# Patient Record
Sex: Male | Born: 1950 | Race: Black or African American | Hispanic: No | Marital: Single | Smoking: Former smoker
Health system: Southern US, Community
[De-identification: ages and names within clinical notes are randomized; demographics above are authoritative.]

## PROBLEM LIST (undated history)

## (undated) DIAGNOSIS — K469 Unspecified abdominal hernia without obstruction or gangrene: Secondary | ICD-10-CM

## (undated) DIAGNOSIS — J189 Pneumonia, unspecified organism: Secondary | ICD-10-CM

## (undated) DIAGNOSIS — B182 Chronic viral hepatitis C: Secondary | ICD-10-CM

## (undated) DIAGNOSIS — Z8619 Personal history of other infectious and parasitic diseases: Secondary | ICD-10-CM

## (undated) DIAGNOSIS — E785 Hyperlipidemia, unspecified: Secondary | ICD-10-CM

## (undated) DIAGNOSIS — A64 Unspecified sexually transmitted disease: Secondary | ICD-10-CM

## (undated) DIAGNOSIS — M1711 Unilateral primary osteoarthritis, right knee: Secondary | ICD-10-CM

## (undated) DIAGNOSIS — I10 Essential (primary) hypertension: Secondary | ICD-10-CM

## (undated) DIAGNOSIS — Z86010 Personal history of colonic polyps: Secondary | ICD-10-CM

## (undated) HISTORY — PX: HERNIA REPAIR: SHX51

## (undated) HISTORY — PX: TONSILLECTOMY: SUR1361

## (undated) HISTORY — DX: Unspecified abdominal hernia without obstruction or gangrene: K46.9

## (undated) HISTORY — DX: Pneumonia, unspecified organism: J18.9

## (undated) HISTORY — DX: Personal history of other infectious and parasitic diseases: Z86.19

## (undated) HISTORY — DX: Unspecified sexually transmitted disease: A64

## (undated) HISTORY — DX: Personal history of colonic polyps: Z86.010

## (undated) HISTORY — DX: Unilateral primary osteoarthritis, right knee: M17.11

## (undated) HISTORY — PX: OTHER SURGICAL HISTORY: SHX169

## (undated) HISTORY — DX: Hyperlipidemia, unspecified: E78.5

## (undated) HISTORY — DX: Chronic viral hepatitis C: B18.2

## (undated) HISTORY — DX: Essential (primary) hypertension: I10

---

## 2005-11-08 ENCOUNTER — Ambulatory Visit: Payer: Self-pay | Admitting: Nurse Practitioner

## 2005-11-14 ENCOUNTER — Ambulatory Visit: Payer: Self-pay | Admitting: Nurse Practitioner

## 2005-11-14 ENCOUNTER — Ambulatory Visit (HOSPITAL_COMMUNITY): Admission: RE | Admit: 2005-11-14 | Discharge: 2005-11-14 | Payer: Self-pay | Admitting: Nurse Practitioner

## 2005-11-21 ENCOUNTER — Ambulatory Visit: Payer: Self-pay | Admitting: Nurse Practitioner

## 2005-11-28 ENCOUNTER — Ambulatory Visit: Payer: Self-pay | Admitting: Internal Medicine

## 2006-10-02 ENCOUNTER — Encounter (INDEPENDENT_AMBULATORY_CARE_PROVIDER_SITE_OTHER): Payer: Self-pay | Admitting: *Deleted

## 2006-10-18 ENCOUNTER — Encounter (INDEPENDENT_AMBULATORY_CARE_PROVIDER_SITE_OTHER): Payer: Self-pay | Admitting: Nurse Practitioner

## 2006-10-18 ENCOUNTER — Ambulatory Visit: Payer: Self-pay | Admitting: *Deleted

## 2006-10-18 ENCOUNTER — Ambulatory Visit: Payer: Self-pay | Admitting: Family Medicine

## 2006-10-18 LAB — CONVERTED CEMR LAB
AST: 33 units/L (ref 0–37)
Albumin: 4.7 g/dL (ref 3.5–5.2)
Alkaline Phosphatase: 56 units/L (ref 39–117)
Basophils Relative: 1 % (ref 0–1)
Eosinophils Absolute: 0.1 10*3/uL (ref 0.0–0.7)
HDL: 40 mg/dL (ref >39)
LDL Cholesterol: 131 mg/dL — ABNORMAL HIGH (ref 0–99)
MCHC: 32.2 g/dL (ref 30.0–36.0)
MCV: 95.5 fL (ref 78.0–100.0)
Neutrophils Relative %: 45 % (ref 43–77)
Platelets: 211 10*3/uL (ref 150–400)
Potassium: 4.2 meq/L (ref 3.5–5.3)
RDW: 13.8 % (ref 11.5–14.0)
Sodium: 140 meq/L (ref 135–145)
TSH: 1.973 microintl units/mL (ref 0.350–5.50)
Total Protein: 8.3 g/dL (ref 6.0–8.3)
VLDL: 13 mg/dL (ref 0–40)

## 2006-10-31 ENCOUNTER — Ambulatory Visit: Payer: Self-pay | Admitting: Internal Medicine

## 2006-11-23 ENCOUNTER — Emergency Department (HOSPITAL_COMMUNITY): Admission: EM | Admit: 2006-11-23 | Discharge: 2006-11-23 | Payer: Self-pay | Admitting: Emergency Medicine

## 2006-12-09 ENCOUNTER — Encounter (INDEPENDENT_AMBULATORY_CARE_PROVIDER_SITE_OTHER): Payer: Self-pay | Admitting: Nurse Practitioner

## 2006-12-09 ENCOUNTER — Ambulatory Visit: Payer: Self-pay | Admitting: Internal Medicine

## 2006-12-09 LAB — CONVERTED CEMR LAB
AST: 28 units/L (ref 0–37)
Albumin: 4.4 g/dL (ref 3.5–5.2)
Alkaline Phosphatase: 48 units/L (ref 39–117)
BUN: 22 mg/dL (ref 6–23)
HDL: 38 mg/dL — ABNORMAL LOW (ref >39)
LDL Cholesterol: 90 mg/dL (ref 0–99)
Potassium: 4.2 meq/L (ref 3.5–5.3)
Total Bilirubin: 0.7 mg/dL (ref 0.3–1.2)
Total CHOL/HDL Ratio: 3.9
VLDL: 21 mg/dL (ref 0–40)

## 2007-02-07 ENCOUNTER — Ambulatory Visit (HOSPITAL_COMMUNITY): Admission: RE | Admit: 2007-02-07 | Discharge: 2007-02-07 | Payer: Self-pay | Admitting: Family Medicine

## 2007-02-18 ENCOUNTER — Ambulatory Visit: Payer: Self-pay | Admitting: Internal Medicine

## 2007-09-12 ENCOUNTER — Ambulatory Visit: Payer: Self-pay | Admitting: Family Medicine

## 2007-10-28 ENCOUNTER — Ambulatory Visit: Payer: Self-pay | Admitting: Internal Medicine

## 2007-10-28 LAB — CONVERTED CEMR LAB
Bacteria, UA: NONE SEEN
Basophils Absolute: 0 10*3/uL (ref 0.0–0.1)
Basophils Relative: 1 % (ref 0–1)
Bilirubin Urine: NEGATIVE
Chlamydia, Swab/Urine, PCR: NEGATIVE
Eosinophils Absolute: 0.1 10*3/uL (ref 0.0–0.7)
Ketones, ur: NEGATIVE mg/dL
MCHC: 33 g/dL (ref 30.0–36.0)
MCV: 94.7 fL (ref 78.0–100.0)
Neutro Abs: 2.7 10*3/uL (ref 1.7–7.7)
Neutrophils Relative %: 52 % (ref 43–77)
PSA: 0.83 ng/mL (ref 0.10–4.00)
Platelets: 236 10*3/uL (ref 150–400)
Protein, ur: NEGATIVE mg/dL
RBC: 4.87 M/uL (ref 4.22–5.81)
RDW: 13.8 % (ref 11.5–15.5)
RPR Ser Ql: REACTIVE — AB
Urine Glucose: NEGATIVE mg/dL
Urobilinogen, UA: 0.2 (ref 0.0–1.0)

## 2007-10-29 ENCOUNTER — Encounter (INDEPENDENT_AMBULATORY_CARE_PROVIDER_SITE_OTHER): Payer: Self-pay | Admitting: Internal Medicine

## 2008-01-05 ENCOUNTER — Ambulatory Visit: Payer: Self-pay | Admitting: Family Medicine

## 2008-03-19 ENCOUNTER — Ambulatory Visit: Payer: Self-pay | Admitting: Family Medicine

## 2008-04-08 ENCOUNTER — Ambulatory Visit: Payer: Self-pay | Admitting: Family Medicine

## 2008-07-12 ENCOUNTER — Ambulatory Visit: Payer: Self-pay | Admitting: Family Medicine

## 2008-07-26 ENCOUNTER — Ambulatory Visit: Payer: Self-pay | Admitting: Internal Medicine

## 2008-10-12 ENCOUNTER — Ambulatory Visit: Payer: Self-pay | Admitting: Family Medicine

## 2008-10-12 LAB — CONVERTED CEMR LAB
Testosterone: 317.33 ng/dL — ABNORMAL LOW (ref 350–890)
Vit D, 25-Hydroxy: 21 ng/mL — ABNORMAL LOW (ref 30–89)

## 2008-12-09 ENCOUNTER — Inpatient Hospital Stay (HOSPITAL_COMMUNITY): Admission: EM | Admit: 2008-12-09 | Discharge: 2008-12-10 | Payer: Self-pay | Admitting: Emergency Medicine

## 2008-12-09 ENCOUNTER — Emergency Department (HOSPITAL_COMMUNITY): Admission: EM | Admit: 2008-12-09 | Discharge: 2008-12-09 | Payer: Self-pay | Admitting: Family Medicine

## 2008-12-09 ENCOUNTER — Ambulatory Visit: Payer: Self-pay | Admitting: Internal Medicine

## 2008-12-10 ENCOUNTER — Encounter (INDEPENDENT_AMBULATORY_CARE_PROVIDER_SITE_OTHER): Payer: Self-pay | Admitting: Internal Medicine

## 2008-12-23 DIAGNOSIS — I10 Essential (primary) hypertension: Secondary | ICD-10-CM | POA: Insufficient documentation

## 2008-12-23 DIAGNOSIS — R1013 Epigastric pain: Secondary | ICD-10-CM

## 2008-12-23 DIAGNOSIS — Z9189 Other specified personal risk factors, not elsewhere classified: Secondary | ICD-10-CM | POA: Insufficient documentation

## 2008-12-23 DIAGNOSIS — R42 Dizziness and giddiness: Secondary | ICD-10-CM

## 2008-12-23 DIAGNOSIS — R079 Chest pain, unspecified: Secondary | ICD-10-CM

## 2008-12-24 ENCOUNTER — Ambulatory Visit: Payer: Self-pay | Admitting: Cardiology

## 2009-01-21 ENCOUNTER — Ambulatory Visit: Payer: Self-pay | Admitting: Cardiology

## 2009-01-21 ENCOUNTER — Encounter: Payer: Self-pay | Admitting: Cardiology

## 2009-01-21 ENCOUNTER — Ambulatory Visit (HOSPITAL_COMMUNITY): Admission: RE | Admit: 2009-01-21 | Discharge: 2009-01-21 | Payer: Self-pay | Admitting: Cardiology

## 2009-01-21 ENCOUNTER — Ambulatory Visit: Payer: Self-pay

## 2009-05-20 ENCOUNTER — Ambulatory Visit: Payer: Self-pay | Admitting: Family Medicine

## 2010-02-20 ENCOUNTER — Ambulatory Visit (INDEPENDENT_AMBULATORY_CARE_PROVIDER_SITE_OTHER): Payer: Self-pay

## 2010-02-20 ENCOUNTER — Inpatient Hospital Stay (INDEPENDENT_AMBULATORY_CARE_PROVIDER_SITE_OTHER)
Admission: RE | Admit: 2010-02-20 | Discharge: 2010-02-20 | Disposition: A | Discharge status: Home / Self Care | Payer: Self-pay | Source: Ambulatory Visit | Attending: Family Medicine | Admitting: Family Medicine

## 2010-02-20 DIAGNOSIS — J069 Acute upper respiratory infection, unspecified: Secondary | ICD-10-CM

## 2010-04-19 LAB — LIPID PANEL
HDL: 36 mg/dL — ABNORMAL LOW (ref >39)
Total CHOL/HDL Ratio: 4.1 RATIO

## 2010-04-19 LAB — HEMOGLOBIN A1C
Hgb A1c MFr Bld: 5.5 % (ref 4.6–6.1)
Mean Plasma Glucose: 111 mg/dL

## 2010-04-19 LAB — TROPONIN I
Troponin I: 0.02 ng/mL (ref 0.00–0.06)
Troponin I: 0.03 ng/mL (ref 0.00–0.06)

## 2010-04-19 LAB — BASIC METABOLIC PANEL
CO2: 21 mEq/L (ref 19–32)
Chloride: 106 mEq/L (ref 96–112)
Creatinine, Ser: 0.9 mg/dL (ref 0.4–1.5)

## 2010-04-19 LAB — DIFFERENTIAL
Lymphs Abs: 1.2 10*3/uL (ref 0.7–4.0)
Monocytes Relative: 5 % (ref 3–12)
Neutro Abs: 4.6 10*3/uL (ref 1.7–7.7)
Neutrophils Relative %: 74 % (ref 43–77)

## 2010-04-19 LAB — CBC
MCV: 95.6 fL (ref 78.0–100.0)
RBC: 4.63 MIL/uL (ref 4.22–5.81)
WBC: 6.2 10*3/uL (ref 4.0–10.5)

## 2010-04-19 LAB — CK TOTAL AND CKMB (NOT AT ARMC)
CK, MB: 6.5 ng/mL — ABNORMAL HIGH (ref 0.3–4.0)
Relative Index: 1.5 (ref 0.0–2.5)
Total CK: 338 U/L — ABNORMAL HIGH (ref 7–232)

## 2010-04-19 LAB — POCT CARDIAC MARKERS: Myoglobin, poc: >500 ng/mL (ref 12–200)

## 2011-12-21 ENCOUNTER — Encounter: Payer: Self-pay | Admitting: Radiation Oncology

## 2011-12-21 ENCOUNTER — Ambulatory Visit (INDEPENDENT_AMBULATORY_CARE_PROVIDER_SITE_OTHER): Payer: No Typology Code available for payment source | Admitting: Radiation Oncology

## 2011-12-21 VITALS — BP 160/90 | HR 77 | Temp 97.8°F | Ht 70.0 in | Wt 228.2 lb

## 2011-12-21 DIAGNOSIS — E785 Hyperlipidemia, unspecified: Secondary | ICD-10-CM

## 2011-12-21 DIAGNOSIS — Z0000 Encounter for general adult medical examination without abnormal findings: Secondary | ICD-10-CM | POA: Insufficient documentation

## 2011-12-21 DIAGNOSIS — I10 Essential (primary) hypertension: Secondary | ICD-10-CM

## 2011-12-21 DIAGNOSIS — Z23 Encounter for immunization: Secondary | ICD-10-CM

## 2011-12-21 DIAGNOSIS — Z8619 Personal history of other infectious and parasitic diseases: Secondary | ICD-10-CM

## 2011-12-21 LAB — CBC
HCT: 45.3 % (ref 39.0–52.0)
MCV: 94 fL (ref 78.0–100.0)
Platelets: 212 10*3/uL (ref 150–400)
RBC: 4.82 MIL/uL (ref 4.22–5.81)
WBC: 4.5 10*3/uL (ref 4.0–10.5)

## 2011-12-21 LAB — LIPID PANEL
HDL: 34 mg/dL — ABNORMAL LOW (ref >39)
Total CHOL/HDL Ratio: 4.8 Ratio
Triglycerides: 84 mg/dL (ref <150)

## 2011-12-21 LAB — COMPREHENSIVE METABOLIC PANEL
CO2: 25 mEq/L (ref 19–32)
Calcium: 9.3 mg/dL (ref 8.4–10.5)
Creat: 0.87 mg/dL (ref 0.50–1.35)
Glucose, Bld: 87 mg/dL (ref 70–99)
Total Bilirubin: 0.9 mg/dL (ref 0.3–1.2)
Total Protein: 7.6 g/dL (ref 6.0–8.3)

## 2011-12-21 MED ORDER — PRAVASTATIN SODIUM 20 MG PO TABS: 20.0000 mg | ORAL_TABLET | Freq: Every evening | ORAL | Status: DC

## 2011-12-21 MED ORDER — SILDENAFIL CITRATE 50 MG PO TABS: 50.0000 mg | ORAL_TABLET | ORAL | Status: DC | PRN

## 2011-12-21 MED ORDER — AMLODIPINE BESYLATE 5 MG PO TABS: 5.0000 mg | ORAL_TABLET | Freq: Every day | ORAL | Status: DC

## 2011-12-21 MED ORDER — ROSUVASTATIN CALCIUM 5 MG PO TABS: 5.0000 mg | ORAL_TABLET | Freq: Every day | ORAL | Status: DC

## 2011-12-21 NOTE — Assessment & Plan Note (Signed)
BP Readings from Last 3 Encounters:  12/21/11 160/90  12/24/08 130/80    Sodium  Date Value Range Status  12/09/2008 137  135 - 145 mEq/L Final     Potassium  Date Value Range Status  12/09/2008 3.9  3.5 - 5.1 mEq/L Final     Creatinine, Ser  Date Value Range Status  12/09/2008 0.90  0.4 - 1.5 mg/dL Final    Assessment:  Blood pressure control: mildly elevated  Progress toward BP goal:     Comments: BP elevated over previous readings, at ~160/90 today. This is the pt's first visit to [facility]. No change will be made to his med regimen (norvasc 5mg ) as he states he has been out of his medications for a few days, which is likely why his BP is elevated today.   Plan:  Medications:  continue current medications  Educational resources provided:    Self management tools provided:    Other plans: Pt will continue norvasc 5mg  and f/u in 1 month. If BP well-controlled at that time, pt may f/u in 61yr.

## 2011-12-21 NOTE — Addendum Note (Signed)
Addended by: Elenor Legato R on: 12/21/2011 12:05 PM   Modules accepted: Orders

## 2011-12-21 NOTE — Assessment & Plan Note (Addendum)
Pt has previously had good control of LDL on crestor 10mg . As pt states he has insomnia with this dose, will decrease to 5mg  as it has similar efficacy. Lipid panel drawn and pending.  -dec crestor from 10mg  -> 5mg  - consider inc statin dose if LDL elevated (at time of f/u in 1 month)  12/21/2011 update: Called from outpatient pharmacy--pt no longer able to afford crestor. Prescribed 20mg  pravastatin, which is on $4 list at [facility]. Appropriateness of continuing this less potent statin should be reassessed at f/u in 1 month if pt's LDL returns as elevated.

## 2011-12-21 NOTE — Progress Notes (Signed)
I discussed the management of this patient with Dr. Lavena Bullion and I agree with his note.   Thanks [employer]

## 2011-12-21 NOTE — Progress Notes (Signed)
  Subjective:    Patient ID: Scott Burgess, male    DOB: [birth year], 61 y.o.   MRN: [id number]  HPI Pt is a 60 yo man with PMH of HTN, HLD, (see "Problem List" for comprehensive PMH) who presents to establish care at the [facility] closure of [facility]. Pt states that he has been feeling well, and needs refills of his medications which include norvasc and crestor. Pt states that the 10mg  crestor causes him to have difficulty sleeping, but states that he has no other med side effects. He states he has been out of both of these meds for a few days. Pt denies any other complaints.    Review of Systems  Constitutional: Negative for fever and chills.  HENT: Negative.   Eyes: Negative.   Respiratory: Negative for shortness of breath and wheezing.   Cardiovascular: Negative for chest pain and leg swelling.  Gastrointestinal: Negative for nausea, vomiting, abdominal pain, diarrhea and blood in stool.  Genitourinary: Negative for dysuria, frequency and hematuria.  Musculoskeletal: Negative.   Skin: Negative.   Neurological: Negative.   Hematological: Negative.   Psychiatric/Behavioral: Negative.        Objective:   Physical Exam  Constitutional: He is oriented to person, place, and time. He appears well-developed and well-nourished. No distress.  HENT:  Head: Normocephalic and atraumatic.  Mouth/Throat: Oropharynx is clear and moist.  Eyes: Conjunctivae normal are normal. Pupils are equal, round, and reactive to light. No scleral icterus.  Neck: Normal range of motion. Neck supple. No tracheal deviation present. No thyromegaly present.  Cardiovascular: Normal rate and regular rhythm.   No murmur heard. Pulmonary/Chest: Effort normal and breath sounds normal. He has no wheezes. He has no rales.  Abdominal: Soft. Bowel sounds are normal. He exhibits no distension. There is no tenderness.  Musculoskeletal: Normal range of motion. He exhibits no edema.  Neurological: He is alert and  oriented to person, place, and time. No cranial nerve deficit.  Skin: Skin is warm and dry. No rash noted.  Psychiatric: He has a normal mood and affect. His behavior is normal.          Assessment & Plan:

## 2011-12-21 NOTE — Patient Instructions (Addendum)
Hypertension Hypertension is another name for high blood pressure. High blood pressure may mean that your heart needs to work harder to pump blood. Blood pressure consists of two numbers, which includes a higher number over a lower number (example: 110/72). HOME CARE   Make lifestyle changes as told by your doctor. This may include weight loss and exercise.  Take your blood pressure medicine every day.  Limit how much salt you use.  Stop smoking if you smoke.  Do not use drugs.  Talk to your doctor if you are using decongestants or birth control pills. These medicines might make blood pressure higher.  Females should not drink more than 1 alcoholic drink per day. Males should not drink more than 2 alcoholic drinks per day.  See your doctor as told. GET HELP RIGHT AWAY IF:   You have a blood pressure reading with a top number of 180 or higher.  You get a very bad headache.  You get blurred or changing vision.  You feel confused.  You feel weak, numb, or faint.  You get chest or belly (abdominal) pain.  You throw up (vomit).  You cannot breathe very well. MAKE SURE YOU:   Understand these instructions.  Will watch your condition.  Will get help right away if you are not doing well or get worse. Document Released: 06/20/2007 Document Revised: 03/26/2011 Document Reviewed: 06/20/2007 [facility] Patient Information 2013 [place], [place].  General Instructions: - Please continue to take your 5mg  norvasc daily as prescribed. - Stop taking your 10mg  crestor and begin taking 5mg  crestor, as we decreased your dose during this visit.  - You will be contacted to schedule a screening colonoscopy.  - Return to clinic in approximately 1 month for a follow-up visit for your high blood pressure, at which time we will also review today's lab work.     Treatment Goals:  Goals (1 Years of Data) as of 12/21/2011          As of Today As of Today 12/24/08     Blood Pressure    .  Blood Pressure < 140/90  160/90 156/89 130/80      Self Care Goals & Plans:  Self Care Goal 12/21/2011  Manage my medications take my medicines as prescribed  Eat healthy foods eat foods that are low in salt  Be physically active take a walk every day

## 2012-01-22 ENCOUNTER — Ambulatory Visit (INDEPENDENT_AMBULATORY_CARE_PROVIDER_SITE_OTHER): Payer: No Typology Code available for payment source | Admitting: Radiation Oncology

## 2012-01-22 ENCOUNTER — Encounter: Payer: Self-pay | Admitting: Radiation Oncology

## 2012-01-22 VITALS — BP 138/86 | HR 69 | Temp 97.9°F | Ht 70.0 in | Wt 227.2 lb

## 2012-01-22 DIAGNOSIS — R945 Abnormal results of liver function studies: Secondary | ICD-10-CM

## 2012-01-22 DIAGNOSIS — N529 Male erectile dysfunction, unspecified: Secondary | ICD-10-CM

## 2012-01-22 DIAGNOSIS — Z8619 Personal history of other infectious and parasitic diseases: Secondary | ICD-10-CM

## 2012-01-22 DIAGNOSIS — Z0000 Encounter for general adult medical examination without abnormal findings: Secondary | ICD-10-CM

## 2012-01-22 DIAGNOSIS — R7989 Other specified abnormal findings of blood chemistry: Secondary | ICD-10-CM

## 2012-01-22 DIAGNOSIS — I10 Essential (primary) hypertension: Secondary | ICD-10-CM

## 2012-01-22 HISTORY — DX: Personal history of other infectious and parasitic diseases: Z86.19

## 2012-01-22 LAB — COMPREHENSIVE METABOLIC PANEL
Alkaline Phosphatase: 52 U/L (ref 39–117)
BUN: 18 mg/dL (ref 6–23)
Glucose, Bld: 93 mg/dL (ref 70–99)
Sodium: 142 mEq/L (ref 135–145)
Total Bilirubin: 0.8 mg/dL (ref 0.3–1.2)

## 2012-01-22 MED ORDER — AMLODIPINE BESYLATE 5 MG PO TABS: 5.0000 mg | ORAL_TABLET | Freq: Every day | ORAL | Status: DC

## 2012-01-22 MED ORDER — PRAVASTATIN SODIUM 20 MG PO TABS: 20.0000 mg | ORAL_TABLET | Freq: Every evening | ORAL | Status: DC

## 2012-01-22 NOTE — Assessment & Plan Note (Addendum)
Patient AST and ALT were elevated in 12/2011. Unclear etiology, as patient denies any history of hepatitis or of previous elevation of LFTs. This issue will require further investigation. 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 He was called and informed of this result. HCV RNA titer was attempted to be drawn as an add-on for this lab result, however was ultimately not performed. Pt appears to have an appointment with Dr. Leone Payor on 03/06/2012, at which time it may be appropriate for him to have his HCV RNA quant checked, otherwise he can return to clinic to have it tested. It is possible that the further elevation in his LFTs may be 2/2 pravastatin, and consideration of d/c'ing this med may be reasonable if LFTs continue to rise (although more likely transient).    Lab Results  Component Value Date   ALT 202* 01/22/2012   AST 128* 01/22/2012   ALKPHOS 52 01/22/2012   BILITOT 0.8 01/22/2012

## 2012-01-22 NOTE — Assessment & Plan Note (Signed)
Patient is pursuing medication assistance for Viagra through [employer]. Requisite paperwork was completed today during visit.

## 2012-01-22 NOTE — Assessment & Plan Note (Signed)
Referral was made again today for colonoscopy, as there was difficulty scheduling previous GI referral visit with [facility].

## 2012-01-22 NOTE — Patient Instructions (Signed)
Continue taking your amlodipine and pravastatin at the same doses. Have a great day.

## 2012-01-22 NOTE — Assessment & Plan Note (Signed)
BP Readings from Last 3 Encounters:  01/22/12 138/86  12/21/11 160/90  12/24/08 130/80    Lab Results  Component Value Date   NA 140 12/21/2011   K 4.0 12/21/2011   CREATININE 0.87 12/21/2011    Assessment:  Blood pressure control: controlled  Progress toward BP goal:  at goal  Comments: improved today after restarting norvasc 5mg   Plan:  Medications:  continue current medications  Educational resources provided:    Self management tools provided:    Other plans: f/u in 6 months

## 2012-01-22 NOTE — Progress Notes (Signed)
  Subjective:    Patient ID: Scott Burgess, male    DOB: 12-Dec-1950, 62 y.o.   MRN: [id number]  HPI Patient returns to clinic for one-month followup regarding his hypertension. He has been taking his Norvasc 5 mg daily since the previous visit.  Patient also has been taking pravastatin 20 mg daily. He was prescribed Viagra, however was unable to afford this but no prescription insurance. He requests for a medication assistance forms be filled out today for a Viagra prescription through [employer].  He denies any other complaints today. Patient had mildly elevated AST and ALT on previous unit in 12/2011. He denies any history of hepatitis or of elevated LFTs in the past. He denies any complaints today, and states that he feels well.  Review of Systems  All other systems reviewed and are negative.       Objective:   Physical Exam  Constitutional: He is oriented to person, place, and time. He appears well-developed and well-nourished. No distress.  HENT:  Head: Normocephalic and atraumatic.  Mouth/Throat: No oropharyngeal exudate.  Eyes: Pupils are equal, round, and reactive to light. No scleral icterus.  Neck: Normal range of motion. Neck supple. No tracheal deviation present.  Cardiovascular: Normal rate and regular rhythm.   No murmur heard. Pulmonary/Chest: Effort normal. He has no wheezes. He has no rales.  Abdominal: Soft. Bowel sounds are normal. He exhibits no distension. There is no tenderness.  Musculoskeletal: Normal range of motion. He exhibits no edema and no tenderness.  Neurological: He is alert and oriented to person, place, and time. No cranial nerve deficit.  Skin: Skin is warm and dry. No erythema.  Psychiatric: He has a normal mood and affect. His behavior is normal.          Assessment & Plan:

## 2012-01-23 LAB — HEPATITIS PANEL, ACUTE
Hep A IgM: NEGATIVE
Hep B C IgM: NEGATIVE

## 2012-01-23 LAB — HIV ANTIBODY (ROUTINE TESTING W REFLEX): HIV: NONREACTIVE

## 2012-02-05 ENCOUNTER — Telehealth: Payer: Self-pay | Admitting: *Deleted

## 2012-02-05 MED ORDER — SILDENAFIL CITRATE 50 MG PO TABS: 50.0000 mg | ORAL_TABLET | ORAL | Status: DC | PRN

## 2012-02-05 NOTE — Telephone Encounter (Signed)
Jan. 21, 2014 Message left by "Greggory Stallion" at [employer] that med package received from our clinic with no patient name attached.  Dr. Lavena Bullion had signed medication assistance program papers for pt for [employer] for Viagra without realizing that clinic had no medication assistance program personnel to track and disperse meds. [employer] med asst program was contacted and a [employer] return label was to be issued to our receiving dock to return the meds. Dr. Lavena Bullion advised of this issue when med bottle arrived Friday, Jan. 17, and returned Friday, Jan. 17. Pt can apply through the [facility] Medication Assistance program (MAP) for the medication if Dr. Lavena Bullion gives him a prescription to take to MAP and Dr. Lavena Bullion so advised. RN sent message to Dr. Lavena Bullion asking if he would inform pt of issue re: med asst program or if he wanted RN to contact pt. No response from Dr. Lavena Bullion received to date.  "Greggory Stallion" at [employer] states clinic needs to call him back and explain which pt meds were for ( packet insert stated pt name when package was returned with the med at the loading dock 1/17). RN attempted to call him back x 2 and left messages as he was currently not available.  Jan. 21 - Pt contacted and informed that [employer] application should have been submitted to MAP, that med sent to [place] and returned to [employer] so that they would know pt had not received the Viagra. Pt had asked Dr. Lavena Bullion to send Rx to [facility] thinking his MAP "paper" would allow him to buy the medicine there at a discount, or be free. [facility] told him each pill would cost $30, so now pt is willing to take the Rx to MAP and complete his form at MAP so that they can process the application and he can get his Viagra "for free like my friend does."  Pt advised that Dr. Lavena Bullion is not in the clinic today but will come by tomorrow to print and sign a prescription which this RN will mail to the pt for the pt to take to MAP and complete a Pfizer application there. Meanwhile, this  RN still trying to contact [place] [employer] rep to confirm med returned and pt never received it at this clinic so that pt will still be eligible for asst thru MAP.  Dorie Rank, RN, 02/06/2012, 3:29P

## 2012-02-12 ENCOUNTER — Telehealth: Payer: Self-pay | Admitting: *Deleted

## 2012-02-12 NOTE — Telephone Encounter (Signed)
I've filled out 3-4 separate prescriptions for him for this medication in attempts to help him get it from multiple different pharmacies. I understand his frustration, however without any insurance it may be that he unfortunately has to pay it out of pocket. We apparently don't manage med assistance from outside vendors such as pfizer in the [facility], as was previously attempted. Jasmine December has assisted me in trying to solve this problem over the last couple of weeks, but it seems we haven't had any luck.  If anyone has any ideas regarding med assistance for viagra for this patient I would appreciate their assistance.

## 2012-02-12 NOTE — Telephone Encounter (Signed)
Returned pt's call. Pt called about how he can receive Viagara from [employer]. Stated he went to MAP this morning and was told they do not carry this medication neither does [facility]. I called [facility] and they do not either.  He stated Dr Lavena Bullion needs to find an answer to this problem.  Pt called back and stated he will call [employer] to see if they will send the med to his home.

## 2012-02-14 ENCOUNTER — Encounter: Payer: Self-pay | Admitting: Internal Medicine

## 2012-02-15 ENCOUNTER — Encounter: Payer: Self-pay | Admitting: Licensed Clinical Social Worker

## 2012-02-15 ENCOUNTER — Encounter: Payer: Self-pay | Admitting: Internal Medicine

## 2012-02-19 NOTE — Addendum Note (Signed)
Addended by: Neomia Dear on: 02/19/2012 06:06 PM   Modules accepted: Orders

## 2012-03-05 ENCOUNTER — Encounter: Payer: Self-pay | Admitting: Licensed Clinical Social Worker

## 2012-03-06 ENCOUNTER — Encounter: Payer: Self-pay | Admitting: Internal Medicine

## 2012-03-06 ENCOUNTER — Other Ambulatory Visit (INDEPENDENT_AMBULATORY_CARE_PROVIDER_SITE_OTHER): Payer: No Typology Code available for payment source

## 2012-03-06 ENCOUNTER — Ambulatory Visit (INDEPENDENT_AMBULATORY_CARE_PROVIDER_SITE_OTHER): Payer: Self-pay | Admitting: Internal Medicine

## 2012-03-06 ENCOUNTER — Encounter: Payer: Self-pay | Admitting: Licensed Clinical Social Worker

## 2012-03-06 VITALS — BP 132/80 | HR 80 | Ht 69.75 in | Wt 231.0 lb

## 2012-03-06 DIAGNOSIS — R748 Abnormal levels of other serum enzymes: Secondary | ICD-10-CM

## 2012-03-06 DIAGNOSIS — Z1211 Encounter for screening for malignant neoplasm of colon: Secondary | ICD-10-CM

## 2012-03-06 DIAGNOSIS — B1920 Unspecified viral hepatitis C without hepatic coma: Secondary | ICD-10-CM

## 2012-03-06 DIAGNOSIS — R7401 Elevation of levels of liver transaminase levels: Secondary | ICD-10-CM

## 2012-03-06 LAB — HEPATIC FUNCTION PANEL: Albumin: 4 g/dL (ref 3.5–5.2)

## 2012-03-06 MED ORDER — NA SULFATE-K SULFATE-MG SULF 17.5-3.13-1.6 GM/177ML PO SOLN: ORAL | Status: DC

## 2012-03-06 NOTE — Patient Instructions (Addendum)
You have been scheduled for a colonoscopy with propofol. Please follow written instructions given to you at your visit today.  Please use the surprep kit you have been given today. If you use inhalers (even only as needed) or a CPAP machine, please bring them with you on the day of your procedure.  Your physician has requested that you go to the basement for lab work before leaving today.  You have been scheduled for an abdominal ultrasound at [facility] (1st floor of hospital) on 03/11/12 at 9am. Please arrive 15 minutes prior to your appointment for registration. Make certain not to have anything to eat or drink 6 hours prior to your appointment. Should you need to reschedule your appointment, please contact radiology at [phone]. This test typically takes about 30 minutes to perform.  Thank you for choosing me and [facility].  Iva Boop, M.D., [facility]

## 2012-03-06 NOTE — Progress Notes (Signed)
Subjective:    Patient ID: Scott Burgess, male    DOB: 02/12/1950, 62 y.o.   MRN: [id number]  HPI This middle-aged man presents for evaluation of abnormal transaminases. He has had elevated transaminases at least twice in the past year with levels up to to 4x NL. No abdominal pain or jaundice. 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 He is abstinent for many years and credits incarceration and [facility] with saving his life. GI ROS is otherwise negative.  No Known Allergies Outpatient Prescriptions Prior to Visit  Medication Sig Dispense Refill  . amLODipine (NORVASC) 5 MG tablet Take 1 tablet (5 mg total) by mouth daily.  30 tablet  5  . pravastatin (PRAVACHOL) 20 MG tablet Take 1 tablet (20 mg total) by mouth every evening.  30 tablet  5  . sildenafil (VIAGRA) 50 MG tablet Take 1 tablet (50 mg total) by mouth as needed for erectile dysfunction.  20 tablet  1   No facility-administered medications prior to visit.   Past Medical History  Diagnosis Date  . Hyperlipidemia   . Hypertension   . Hernia   . Pneumonia   . 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 Tonsillectomhy     History   Social History  . Marital Status: Single    Spouse Name: N/A    Number of Children: 3  . Years of Education: N/A   Occupational History  .     Social History Main Topics  . Smoking status: Former Smoker    Quit date: 01/15/2005  . Smokeless tobacco: Never Used  . Alcohol Use: No  . Drug Use: No    Family History  Problem Relation Age of Onset  . Diabetes Mother   . Diabetes Maternal Grandmother    Review of Systems + allergies, arthritis, night sweats, skin rash All other ROS negative    Objective:   Physical Exam General:  Well-developed, well-nourished and in no acute  distress Eyes:  anicteric. ENT:   Mouth and posterior pharynx free of lesions,but missing teeth and poor repair, caries  Neck:   supple w/o thyromegaly or mass.  Lungs: Clear to auscultation bilaterally. Heart:  S1S2, no rubs, murmurs, gallops. Abdomen:  soft, non-tender, no hepatosplenomegaly, hernia, or mass and BS+.  Rectal: deferred Lymph:  no cervical or supraclavicular adenopathy. Extremities:   no edema Skin   no rash or stigmata chronic liver disease in upper body Neuro:  A&O x 3.  Psych:  appropriate mood and  Affect.   Data Reviewed: Labs in [facility]    Assessment & Plan:  Abnormal transaminases - Plan: Hepatic function panel, Hepatitis A antibody, total, Hepatitis B surface antibody, Hepatitis B core antibody, total, Hepatitis C genotype, Hepatitis C RNA quantitative  Hepatitis C - Plan: Hepatitis C genotype, US Abdomen Limited RUQ, Hepatitis C RNA quantitative   Need to sort out genotype and get imaging of liver. He may be a candidate for HCV therapy - ? If he would qualify for a trial as he could not afford therapy. Liver bipsy will not change what we do right now, I do not think.   Special screening for malignant neoplasms, colon - Plan: colonoscopy. The risks and benefits as well as alternatives of endoscopic  procedure(s) have been discussed and reviewed. All questions answered. The patient agrees to proceed.   WU:[id number], Clearence Cheek, MD

## 2012-03-06 NOTE — Progress Notes (Signed)
Ms. Dougal presents as a Walk-in this morning to see CSW regarding [employer] application. CSW slowly explained to pt that [facility] does not participate in [employer]'s patient assistance program.  Pt did not understand why he could not pick up medication from [facility].  CSW informed Mr. Weber due to budget constraints and staffing [facility] could not participate, therefore the [facility] application was mailed to him.  CSW explained that [employer] works along with [employer]'s PAP.  Pt had mailed incomplete application back to CSW.  CSW informed Mr. Boodram of items he is in need of to complete application.  Pt provided CSW with original Rx for Viagra and W-2 information from 2013.  CSW made copy of pt's  ID and fax completed application to [phone], attention: Wynonia Sours.  Ms. Kathyrn Sheriff is pt's assigned Social Worker at [employer].  CSW faxed original Rx to [employer] [phone].  CSW informed Mr. Radin, [employer] will be contacting him to finish application process and provide additional information.  Original application and fax confirmation mailed back to Mr. Kubicki.

## 2012-03-07 LAB — HEPATITIS A ANTIBODY, TOTAL: Hep A Total Ab: POSITIVE — AB

## 2012-03-07 LAB — HEPATITIS B CORE ANTIBODY, TOTAL: Hep B Core Total Ab: POSITIVE — AB

## 2012-03-10 ENCOUNTER — Encounter: Payer: Self-pay | Admitting: Internal Medicine

## 2012-03-10 ENCOUNTER — Ambulatory Visit (AMBULATORY_SURGERY_CENTER): Payer: Self-pay | Admitting: Internal Medicine

## 2012-03-10 VITALS — BP 143/95 | HR 69 | Temp 97.4°F | Resp 21 | Ht 69.0 in | Wt 231.0 lb

## 2012-03-10 DIAGNOSIS — D126 Benign neoplasm of colon, unspecified: Secondary | ICD-10-CM

## 2012-03-10 DIAGNOSIS — Z1211 Encounter for screening for malignant neoplasm of colon: Secondary | ICD-10-CM

## 2012-03-10 HISTORY — PX: COLONOSCOPY: SHX174

## 2012-03-10 LAB — HEPATITIS C GENOTYPE: HCV Genotype: 2

## 2012-03-10 MED ORDER — SODIUM CHLORIDE 0.9 % IV SOLN: 500.0000 mL | INTRAVENOUS | Status: DC

## 2012-03-10 NOTE — Patient Instructions (Addendum)
One tiny polyp was seen and removed. It is not a problem - I will let you know what it was after pathology analysis is in.  I will also see if there are any ways you could get treatment for the Hepatitis C and let you know after I see the ultrasound results from tomorrow.  I will let you know pathology results and when to have another routine colonoscopy by mail.  Thank you for choosing me and [facility].  Iva Boop, MD, [facility]  See handout given on colon polyps. Resume current medications. Call us with any questions or concerns. Thank you!!  YOU HAD AN ENDOSCOPIC PROCEDURE TODAY AT THE [facility]: Refer to the procedure report that was given to you for any specific questions about what was found during the examination.  If the procedure report does not answer your questions, please call your gastroenterologist to clarify.  If you requested that your care partner not be given the details of your procedure findings, then the procedure report has been included in a sealed envelope for you to review at your convenience later.  YOU SHOULD EXPECT: Some feelings of bloating in the abdomen. Passage of more gas than usual.  Walking can help get rid of the air that was put into your GI tract during the procedure and reduce the bloating. If you had a lower endoscopy (such as a colonoscopy or flexible sigmoidoscopy) you may notice spotting of blood in your stool or on the toilet paper. If you underwent a bowel prep for your procedure, then you may not have a normal bowel movement for a few days.  DIET: Your first meal following the procedure should be a light meal and then it is ok to progress to your normal diet.  A half-sandwich or bowl of soup is an example of a good first meal.  Heavy or fried foods are harder to digest and may make you feel nauseous or bloated.  Likewise meals heavy in dairy and vegetables can cause extra gas to form and this can also increase the bloating.   Drink plenty of fluids but you should avoid alcoholic beverages for 24 hours.  ACTIVITY: Your care partner should take you home directly after the procedure.  You should plan to take it easy, moving slowly for the rest of the day.  You can resume normal activity the day after the procedure however you should NOT DRIVE or use heavy machinery for 24 hours (because of the sedation medicines used during the test).    SYMPTOMS TO REPORT IMMEDIATELY: A gastroenterologist can be reached at any hour.  During normal business hours, 8:30 AM to 5:00 PM Monday through Friday, call [phone].  After hours and on weekends, please call the GI answering service at [phone] who will take a message and have the physician on call contact you.   Following lower endoscopy (colonoscopy or flexible sigmoidoscopy):  Excessive amounts of blood in the stool  Significant tenderness or worsening of abdominal pains  Swelling of the abdomen that is new, acute  Fever of 100F or higher  Following upper endoscopy (EGD)  Vomiting of blood or coffee ground material  New chest pain or pain under the shoulder blades  Painful or persistently difficult swallowing  New shortness of breath  Fever of 100F or higher  Black, tarry-looking stools  FOLLOW UP: If any biopsies were taken you will be contacted by phone or by letter within the next 1-3 weeks.  Call your gastroenterologist if you have not heard about the biopsies in 3 weeks.  Our staff will call the home number listed on your records the next business day following your procedure to check on you and address any questions or concerns that you may have at that time regarding the information given to you following your procedure. This is a courtesy call and so if there is no answer at the home number and we have not heard from you through the emergency physician on call, we will assume that you have returned to your regular daily activities without  incident.  SIGNATURES/CONFIDENTIALITY: You and/or your care partner have signed paperwork which will be entered into your electronic medical record.  These signatures attest to the fact that that the information above on your After Visit Summary has been reviewed and is understood.  Full responsibility of the confidentiality of this discharge information lies with you and/or your care-partner.

## 2012-03-10 NOTE — Progress Notes (Signed)
Called to room to assist during endoscopic procedure.  Patient ID and intended procedure confirmed with present staff. Received instructions for my participation in the procedure from the performing physician.  

## 2012-03-10 NOTE — Progress Notes (Signed)
Patient did not experience any of the following events: a burn prior to discharge; a fall within the facility; wrong site/side/patient/procedure/implant event; or a hospital transfer or hospital admission upon discharge from the facility. (G8907) Patient did not have preoperative order for IV antibiotic SSI prophylaxis. (G8918)  

## 2012-03-10 NOTE — Op Note (Signed)
[facility] [address] [place], [address]   COLONOSCOPY PROCEDURE REPORT  PATIENT: Scott, Burgess  MR#: [id number] BIRTHDATE: 01-31-1950 , 61  yrs. old GENDER: Male ENDOSCOPIST: Iva Boop, MD, [facility] REFERRED BY:   Ether Griffins, MD PROCEDURE DATE:  03/10/2012 PROCEDURE:   Colonoscopy with biopsy ASA CLASS:   Class II INDICATIONS:average risk screening. MEDICATIONS: propofol (Diprivan) 250mg  IV, MAC sedation, administered by CRNA, and These medications were titrated to patient response per physician's verbal order  DESCRIPTION OF PROCEDURE:   After the risks benefits and alternatives of the procedure were thoroughly explained, informed consent was obtained.  A digital rectal exam revealed no abnormalities of the rectum, A digital rectal exam revealed the prostate was not enlarged, and A digital rectal exam revealed no prostatic nodules.   The LB [id number]-[id number]  endoscope was introduced through the anus and advanced to the cecum, which was identified by both the appendix and ileocecal valve. No adverse events experienced.   The quality of the prep was Suprep excellent The instrument was then slowly withdrawn as the colon was fully examined.      COLON FINDINGS: A polypoid shaped sessile polyp measuring 2 mm in size was found at the splenic flexure.  A polypectomy was performed with cold forceps.  The resection was complete and the polyp tissue was completely retrieved.   The colon mucosa was otherwise normal. A right colon retroflexion was performed.  Retroflexed views revealed no abnormalities. The time to cecum=1 minutes 44 seconds. Withdrawal time=9 minutes 40 seconds.  The scope was withdrawn and the procedure completed. COMPLICATIONS: There were no complications.  ENDOSCOPIC IMPRESSION: 1.   Sessile polyp measuring 2 mm in size was found at the splenic flexure; polypectomy was performed with cold forceps 2.   The colon mucosa was  otherwise normal  RECOMMENDATIONS: Timing of repeat colonoscopy will be determined by pathology findings.   eSigned:  Iva Boop, MD, [facility] 03/10/2012 3:10 PM   cc: The Patient and Ether Griffins, MD

## 2012-03-11 ENCOUNTER — Telehealth: Payer: Self-pay | Admitting: *Deleted

## 2012-03-11 ENCOUNTER — Ambulatory Visit (HOSPITAL_COMMUNITY)
Admission: RE | Admit: 2012-03-11 | Discharge: 2012-03-11 | Disposition: A | Discharge status: Home / Self Care | Payer: No Typology Code available for payment source | Source: Ambulatory Visit | Attending: Internal Medicine | Admitting: Internal Medicine

## 2012-03-11 ENCOUNTER — Other Ambulatory Visit: Payer: Self-pay | Admitting: Internal Medicine

## 2012-03-11 DIAGNOSIS — K7689 Other specified diseases of liver: Secondary | ICD-10-CM | POA: Insufficient documentation

## 2012-03-11 DIAGNOSIS — B1920 Unspecified viral hepatitis C without hepatic coma: Secondary | ICD-10-CM | POA: Insufficient documentation

## 2012-03-11 DIAGNOSIS — N281 Cyst of kidney, acquired: Secondary | ICD-10-CM | POA: Insufficient documentation

## 2012-03-11 NOTE — Telephone Encounter (Signed)
  Follow up Call-  Call back number 03/10/2012  Post procedure Call Back phone  # [phone]  Permission to leave phone message Yes     Patient questions:  Do you have a fever, pain , or abdominal swelling? no Pain Score  0 *  Have you tolerated food without any problems? yes  Have you been able to return to your normal activities? yes  Do you have any questions about your discharge instructions: Diet   no Medications  no Follow up visit  no  Do you have questions or concerns about your Care? no  Actions: * If pain score is 4 or above: No action needed, pain <4.

## 2012-03-12 NOTE — Progress Notes (Signed)
Quick Note:  US shows cysts - not thought to be a problem We are checking on possible HCV Tx and will let him know  Please call these results ______

## 2012-03-12 NOTE — Progress Notes (Signed)
Quick Note:  Please check with Dr. Ferd Hibbs clinic and [facility] clinic in [facility] to see if they have any trials for HCV genotyope 2 patients - he is uninsured - ? If he could get free Tx ______

## 2012-03-13 ENCOUNTER — Encounter: Payer: Self-pay | Admitting: Internal Medicine

## 2012-03-13 NOTE — Progress Notes (Signed)
Quick Note:  OK - let him know we will contact him if we here - its ok to wait Place on OV recall list for August ______

## 2012-03-14 ENCOUNTER — Encounter: Payer: Self-pay | Admitting: Internal Medicine

## 2012-03-17 ENCOUNTER — Encounter: Payer: Self-pay | Admitting: Internal Medicine

## 2012-03-17 DIAGNOSIS — Z860100 Personal history of colon polyps, unspecified: Secondary | ICD-10-CM

## 2012-03-17 HISTORY — DX: Personal history of colonic polyps: Z86.010

## 2012-03-17 HISTORY — DX: Personal history of colon polyps, unspecified: Z86.0100

## 2012-03-17 NOTE — Progress Notes (Signed)
Quick Note:  2 mm adenoma Repeat colonoscopy 7 yrs - 02/2019 ______

## 2012-04-23 ENCOUNTER — Other Ambulatory Visit: Payer: Self-pay | Admitting: *Deleted

## 2012-04-23 NOTE — Telephone Encounter (Signed)
review 

## 2012-05-07 ENCOUNTER — Other Ambulatory Visit: Payer: Self-pay | Admitting: *Deleted

## 2012-05-07 NOTE — Telephone Encounter (Signed)
[facility] has closed; [facility] needs new rx.  Thanks

## 2012-05-13 ENCOUNTER — Encounter: Payer: Self-pay | Admitting: Internal Medicine

## 2012-05-13 MED ORDER — PRAVASTATIN SODIUM 20 MG PO TABS: 20.0000 mg | ORAL_TABLET | Freq: Every evening | ORAL | Status: DC

## 2012-05-14 ENCOUNTER — Encounter: Payer: Self-pay | Admitting: Internal Medicine

## 2012-05-14 ENCOUNTER — Ambulatory Visit (INDEPENDENT_AMBULATORY_CARE_PROVIDER_SITE_OTHER): Payer: No Typology Code available for payment source | Admitting: Internal Medicine

## 2012-05-14 VITALS — BP 132/82 | HR 89 | Temp 98.1°F | Ht 70.5 in | Wt 230.3 lb

## 2012-05-14 DIAGNOSIS — G4700 Insomnia, unspecified: Secondary | ICD-10-CM

## 2012-05-14 DIAGNOSIS — I10 Essential (primary) hypertension: Secondary | ICD-10-CM

## 2012-05-14 MED ORDER — ZOLPIDEM TARTRATE 5 MG PO TABS: 5.0000 mg | ORAL_TABLET | Freq: Every evening | ORAL | Status: DC | PRN

## 2012-05-14 NOTE — Patient Instructions (Addendum)
General Instructions: For your difficulty sleeping, it is important to practice good sleep hygiene.  Please read the inclosed information below on tips to improve the quality of your sleep, particularly: 1. Avoid drinking caffeinated beverages (including regular tea) after noon 2. Avoid bright lights, including TV or computer screens, 2-3 hours before bed 3. Use your bed only for sleeping (not for working, reading, etc.)  To help you sleep in the short-term, we are prescribing a 64-month course of Ambien.  This medication can help you sleep, but should not be used long-term.  Please return for a follow-up visit in 6 months.  Treatment Goals:  Goals (1 Years of Data) as of 05/14/12         As of Today 03/10/12 03/10/12 03/10/12 03/10/12     Blood Pressure    . Blood Pressure < 140/90  132/82 143/95 146/100 137/77 117/70      Progress Toward Treatment Goals:  Treatment Goal 05/14/2012  Blood pressure at goal    Self Care Goals & Plans:  Self Care Goal 05/14/2012  Manage my medications take my medicines as prescribed; bring my medications to every visit  Monitor my health keep track of my blood pressure  Eat healthy foods eat foods that are low in salt  Be physically active take a walk every day       Care Management & Community Referrals:  Referral 05/14/2012  Referrals made for care management support none needed  Referrals made to community resources -     Insomnia Insomnia is frequent trouble falling and/or staying asleep. Insomnia can be a long term problem or a short term problem. Both are common. Insomnia can be a short term problem when the wakefulness is related to a certain stress or worry. Long term insomnia is often related to ongoing stress during waking hours and/or poor sleeping habits. Overtime, sleep deprivation itself can make the problem worse. Every little thing feels more severe because you are overtired and your ability to cope is decreased. CAUSES   Stress,  anxiety, and depression.  Poor sleeping habits.  Distractions such as TV in the bedroom.  Naps close to bedtime.  Engaging in emotionally charged conversations before bed.  Technical reading before sleep.  Alcohol and other sedatives. They may make the problem worse. They can hurt normal sleep patterns and normal dream activity.  Stimulants such as caffeine for several hours prior to bedtime.  Pain syndromes and shortness of breath can cause insomnia.  Exercise late at night.  Changing time zones may cause sleeping problems (jet lag). It is sometimes helpful to have someone observe your sleeping patterns. They should look for periods of not breathing during the night (sleep apnea). They should also look to see how long those periods last. If you live alone or observers are uncertain, you can also be observed at a sleep clinic where your sleep patterns will be professionally monitored. Sleep apnea requires a checkup and treatment. Give your caregivers your medical history. Give your caregivers observations your family has made about your sleep.  SYMPTOMS   Not feeling rested in the morning.  Anxiety and restlessness at bedtime.  Difficulty falling and staying asleep. TREATMENT   Your caregiver may prescribe treatment for an underlying medical disorders. Your caregiver can give advice or help if you are using alcohol or other drugs for self-medication. Treatment of underlying problems will usually eliminate insomnia problems.  Medications can be prescribed for short time use. They are generally not recommended  for lengthy use.  Over-the-counter sleep medicines are not recommended for lengthy use. They can be habit forming.  You can promote easier sleeping by making lifestyle changes such as:  Using relaxation techniques that help with breathing and reduce muscle tension.  Exercising earlier in the day.  Changing your diet and the time of your last meal. No night time  snacks.  Establish a regular time to go to bed.  Counseling can help with stressful problems and worry.  Soothing music and white noise may be helpful if there are background noises you cannot remove.  Stop tedious detailed work at least one hour before bedtime. HOME CARE INSTRUCTIONS   Keep a diary. Inform your caregiver about your progress. This includes any medication side effects. See your caregiver regularly. Take note of:  Times when you are asleep.  Times when you are awake during the night.  The quality of your sleep.  How you feel the next day. This information will help your caregiver care for you.  Get out of bed if you are still awake after 15 minutes. Read or do some quiet activity. Keep the lights down. Wait until you feel sleepy and go back to bed.  Keep regular sleeping and waking hours. Avoid naps.  Exercise regularly.  Avoid distractions at bedtime. Distractions include watching television or engaging in any intense or detailed activity like attempting to balance the household checkbook.  Develop a bedtime ritual. Keep a familiar routine of bathing, brushing your teeth, climbing into bed at the same time each night, listening to soothing music. Routines increase the success of falling to sleep faster.  Use relaxation techniques. This can be using breathing and muscle tension release routines. It can also include visualizing peaceful scenes. You can also help control troubling or intruding thoughts by keeping your mind occupied with boring or repetitive thoughts like the old concept of counting sheep. You can make it more creative like imagining planting one beautiful flower after another in your backyard garden.  During your day, work to eliminate stress. When this is not possible use some of the previous suggestions to help reduce the anxiety that accompanies stressful situations. MAKE SURE YOU:   Understand these instructions.  Will watch your  condition.  Will get help right away if you are not doing well or get worse. Document Released: 12/30/1999 Document Revised: 03/26/2011 Document Reviewed: 01/29/2007 [facility] Patient Information 2013 [place], [place].

## 2012-05-14 NOTE — Assessment & Plan Note (Signed)
BP Readings from Last 3 Encounters:  05/14/12 132/82  03/10/12 143/95  03/06/12 132/80    Lab Results  Component Value Date   NA 142 01/22/2012   K 3.8 01/22/2012   CREATININE 1.09 01/22/2012    Assessment: Blood pressure control: controlled Progress toward BP goal:  at goal Comments: At goal  Plan: Medications:  continue current medications Educational resources provided:   Self management tools provided:   Other plans: Recheck at next visit

## 2012-05-14 NOTE — Assessment & Plan Note (Signed)
The patient notes a history of insomnia, with poor sleep hygiene.  We discussed tips to improve his sleep hygiene.  The patient was also given a 10-month prescription for ambien to help him acutely while he makes long-term changes to improve his quality of sleep. -61-month only course of ambien -handout given on sleep hygiene

## 2012-05-14 NOTE — Progress Notes (Signed)
Case discussed with Dr. Brown at the time of the visit.  We reviewed the resident's history and exam and pertinent patient test results.  I agree with the assessment, diagnosis and plan of care documented in the resident's note. 

## 2012-05-14 NOTE — Progress Notes (Signed)
HPI The patient is a 62 y.o. male with a history of HTN, HCV, HL, presenting for a routine follow-up visit.  The patient notes concern over recent healthcare bills.  He requests financial assistance.  The patient notes difficulty sleeping at night.  He notes trying to drink hot tea at night, which has not helped.  He is unsure if the tea contains caffeine, but states it is regular lipton tea.  We discussed sleep hygiene.  He typically goes to bed around 8pm, often eating right before bed.  He wakes up at 5:20 am to get ready for work.  He occasionally drinks caffeinated sodas, though less than 1 per day.  The patient watches TV for an hour immediately before bed.  He occasionally reads in bed.  ROS: General: no fevers, chills, changes in weight, changes in appetite Skin: no rash HEENT: no blurry vision, hearing changes, sore throat Pulm: no dyspnea, coughing, wheezing CV: no chest pain, palpitations, shortness of breath Abd: no abdominal pain, nausea/vomiting, diarrhea/constipation GU: no dysuria, hematuria, polyuria Ext: no arthralgias, myalgias Neuro: no weakness, numbness, or tingling  Filed Vitals:   05/14/12 1432  BP: 132/82  Pulse: 89  Temp: 98.1 F (36.7 C)    PEX General: alert, cooperative, and in no apparent distress HEENT: pupils equal round and reactive to light, vision grossly intact, oropharynx clear and non-erythematous  Neck: supple, no lymphadenopathy Lungs: clear to ascultation bilaterally, normal work of respiration, no wheezes, rales, ronchi Heart: regular rate and rhythm, no murmurs, gallops, or rubs Abdomen: soft, non-tender, non-distended, normal bowel sounds Extremities: no cyanosis, clubbing, or edema Neurologic: alert & oriented X3, cranial nerves II-XII intact, strength grossly intact, sensation intact to light touch  Current Outpatient Prescriptions on File Prior to Visit  Medication Sig Dispense Refill  . amLODipine (NORVASC) 5 MG tablet Take 1  tablet (5 mg total) by mouth daily.  30 tablet  5  . Multiple Vitamins-Minerals (CENTRUM SILVER ADULT 50+) TABS Take 1 tablet by mouth daily.      . pravastatin (PRAVACHOL) 20 MG tablet Take 1 tablet (20 mg total) by mouth every evening.  30 tablet  11  . sildenafil (VIAGRA) 50 MG tablet Take 1 tablet (50 mg total) by mouth as needed for erectile dysfunction.  20 tablet  1   No current facility-administered medications on file prior to visit.    Assessment/Plan

## 2012-07-07 ENCOUNTER — Other Ambulatory Visit: Payer: Self-pay | Admitting: *Deleted

## 2012-07-07 ENCOUNTER — Telehealth: Payer: Self-pay | Admitting: Internal Medicine

## 2012-07-07 MED ORDER — PRAVASTATIN SODIUM 20 MG PO TABS: 20.0000 mg | ORAL_TABLET | Freq: Every evening | ORAL | Status: DC

## 2012-07-07 MED ORDER — AMLODIPINE BESYLATE 5 MG PO TABS: 5.0000 mg | ORAL_TABLET | Freq: Every day | ORAL | Status: DC

## 2012-07-07 NOTE — Telephone Encounter (Signed)
Called in to pharmacy - both Rx.

## 2012-07-07 NOTE — Telephone Encounter (Signed)
Both Rx called in to pharmacy. 

## 2012-07-07 NOTE — Telephone Encounter (Signed)
Refills faxed to pharmacy - please confirm receipt.

## 2012-07-08 ENCOUNTER — Emergency Department (HOSPITAL_COMMUNITY)
Admission: EM | Admit: 2012-07-08 | Discharge: 2012-07-08 | Disposition: A | Discharge status: Home / Self Care | Payer: No Typology Code available for payment source | Attending: Emergency Medicine | Admitting: Emergency Medicine

## 2012-07-08 ENCOUNTER — Other Ambulatory Visit: Payer: Self-pay

## 2012-07-08 ENCOUNTER — Emergency Department (HOSPITAL_COMMUNITY): Payer: No Typology Code available for payment source

## 2012-07-08 ENCOUNTER — Encounter (HOSPITAL_COMMUNITY): Payer: Self-pay | Admitting: Adult Health

## 2012-07-08 DIAGNOSIS — Z79899 Other long term (current) drug therapy: Secondary | ICD-10-CM | POA: Insufficient documentation

## 2012-07-08 DIAGNOSIS — R52 Pain, unspecified: Secondary | ICD-10-CM | POA: Insufficient documentation

## 2012-07-08 DIAGNOSIS — R110 Nausea: Secondary | ICD-10-CM | POA: Insufficient documentation

## 2012-07-08 DIAGNOSIS — E785 Hyperlipidemia, unspecified: Secondary | ICD-10-CM | POA: Insufficient documentation

## 2012-07-08 DIAGNOSIS — Z8619 Personal history of other infectious and parasitic diseases: Secondary | ICD-10-CM | POA: Insufficient documentation

## 2012-07-08 DIAGNOSIS — M791 Myalgia, unspecified site: Secondary | ICD-10-CM

## 2012-07-08 DIAGNOSIS — R05 Cough: Secondary | ICD-10-CM | POA: Insufficient documentation

## 2012-07-08 DIAGNOSIS — R51 Headache: Secondary | ICD-10-CM | POA: Insufficient documentation

## 2012-07-08 DIAGNOSIS — Z87891 Personal history of nicotine dependence: Secondary | ICD-10-CM | POA: Insufficient documentation

## 2012-07-08 DIAGNOSIS — Z8719 Personal history of other diseases of the digestive system: Secondary | ICD-10-CM | POA: Insufficient documentation

## 2012-07-08 DIAGNOSIS — Z8701 Personal history of pneumonia (recurrent): Secondary | ICD-10-CM | POA: Insufficient documentation

## 2012-07-08 DIAGNOSIS — R197 Diarrhea, unspecified: Secondary | ICD-10-CM | POA: Insufficient documentation

## 2012-07-08 DIAGNOSIS — E876 Hypokalemia: Secondary | ICD-10-CM

## 2012-07-08 DIAGNOSIS — R059 Cough, unspecified: Secondary | ICD-10-CM | POA: Insufficient documentation

## 2012-07-08 DIAGNOSIS — I10 Essential (primary) hypertension: Secondary | ICD-10-CM | POA: Insufficient documentation

## 2012-07-08 LAB — URINALYSIS, ROUTINE W REFLEX MICROSCOPIC
Ketones, ur: 15 mg/dL — AB
Leukocytes, UA: NEGATIVE
Nitrite: NEGATIVE
Specific Gravity, Urine: 1.03 (ref 1.005–1.030)
pH: 6 (ref 5.0–8.0)

## 2012-07-08 LAB — RAPID URINE DRUG SCREEN, HOSP PERFORMED
Benzodiazepines: NOT DETECTED
Cocaine: NOT DETECTED
Opiates: NOT DETECTED

## 2012-07-08 LAB — CBC WITH DIFFERENTIAL/PLATELET
Basophils Absolute: 0 10*3/uL (ref 0.0–0.1)
Basophils Relative: 0 % (ref 0–1)
MCHC: 35.1 g/dL (ref 30.0–36.0)
Monocytes Absolute: 0.9 10*3/uL (ref 0.1–1.0)
Neutro Abs: 10.2 10*3/uL — ABNORMAL HIGH (ref 1.7–7.7)
Neutrophils Relative %: 82 % — ABNORMAL HIGH (ref 43–77)
Platelets: 181 10*3/uL (ref 150–400)
RDW: 13.2 % (ref 11.5–15.5)

## 2012-07-08 LAB — COMPREHENSIVE METABOLIC PANEL
BUN: 21 mg/dL (ref 6–23)
Calcium: 8.9 mg/dL (ref 8.4–10.5)
Creatinine, Ser: 1.05 mg/dL (ref 0.50–1.35)
GFR calc Af Amer: 87 mL/min — ABNORMAL LOW (ref >90)
Glucose, Bld: 117 mg/dL — ABNORMAL HIGH (ref 70–99)
Total Protein: 7.9 g/dL (ref 6.0–8.3)

## 2012-07-08 LAB — URINE MICROSCOPIC-ADD ON

## 2012-07-08 MED ORDER — SODIUM CHLORIDE 0.9 % IV BOLUS (SEPSIS)
1000.0000 mL | Freq: Once | INTRAVENOUS | Status: AC
  Administered 2012-07-08: 1000 mL via INTRAVENOUS

## 2012-07-08 MED ORDER — POTASSIUM CHLORIDE ER 10 MEQ PO TBCR: 20.0000 meq | EXTENDED_RELEASE_TABLET | Freq: Two times a day (BID) | ORAL | Status: DC

## 2012-07-08 MED ORDER — POTASSIUM CHLORIDE CRYS ER 20 MEQ PO TBCR
40.0000 meq | EXTENDED_RELEASE_TABLET | Freq: Once | ORAL | Status: AC
  Administered 2012-07-08: 40 meq via ORAL
  Filled 2012-07-08: qty 2

## 2012-07-08 NOTE — ED Notes (Signed)
2 iv team nurses at bedside states will send a third IV team member to attempt IV

## 2012-07-08 NOTE — ED Provider Notes (Signed)
History    CSN: [id number] Arrival date & time 07/08/12  0865  First MD Initiated Contact with Patient 07/08/12 [phone]     Chief Complaint  Patient presents with  . Fever   (Consider location/radiation/quality/duration/timing/severity/associated sxs/prior Treatment) HPI  62 y.o. Male with history of hypertension, high cholesterol presents complaining of not feeling well beginning yesterday am on awakening.  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  Went home and went to bed early and states still isn't better- had some headache, felt like he had a fever, chills, some cough, nausea, no vomiting, two loose bowel movements, generalized body aches.        Denies nasal congestion , sore throat, vomiting.  Patient living in at [facility].  Stays in staff dorms-someone had similar symptoms a month ago there and it took them several days to get rid of it.   Past Medical History  Diagnosis Date  . Hyperlipidemia   . Hypertension   . Hernia   . Pneumonia   . STD (male)   . Hepatitis C, chronic     genotype 2  . Personal history of colonic adenoma 03/17/2012   Past Surgical History  Procedure Laterality Date  . Tonsillectomhy     Family History  Problem Relation Age of Onset  . Diabetes Mother   . Diabetes Maternal Grandmother    History  Substance Use Topics  . Smoking status: Former Smoker    Quit date: 01/15/2005  . Smokeless tobacco: Never Used  . Alcohol Use: No    Review of Systems  All other systems reviewed and are negative.    Allergies  Review of patient's allergies indicates no known allergies.  Home Medications   Current Outpatient Rx  Name  Route  Sig  Dispense  Refill  . amLODipine (NORVASC) 5 MG tablet   Oral   Take 1 tablet (5 mg total) by mouth daily.   30 tablet   2   . Multiple Vitamins-Minerals (CENTRUM SILVER ADULT 50+) TABS   Oral   Take 1 tablet by mouth daily.         . pravastatin (PRAVACHOL) 20 MG tablet   Oral   Take 1  tablet (20 mg total) by mouth every evening.   30 tablet   2   . sildenafil (VIAGRA) 50 MG tablet   Oral   Take 1 tablet (50 mg total) by mouth as needed for erectile dysfunction.   20 tablet   1    BP 128/85  Pulse 120  Temp(Src) 99.9 F (37.7 C) (Oral)  Resp 16  SpO2 95% Physical Exam  Nursing note and vitals reviewed. Constitutional: He is oriented to person, place, and time. He appears well-developed and well-nourished.  HENT:  Head: Normocephalic and atraumatic.  Right Ear: External ear normal.  Left Ear: External ear normal.  Nose: Nose normal.  Mouth/Throat: Oropharynx is clear and moist.  Eyes: Conjunctivae and EOM are normal. Pupils are equal, round, and reactive to light.  Neck: Normal range of motion. Neck supple.  Cardiovascular: Normal rate, regular rhythm, normal heart sounds and intact distal pulses.   Pulmonary/Chest: Effort normal and breath sounds normal. No respiratory distress. He has no wheezes. He exhibits no tenderness.  Abdominal: Soft. Bowel sounds are normal. He exhibits no distension and no mass. There is no tenderness. There is no guarding.  Musculoskeletal: Normal range of motion.  Neurological: He is alert and oriented to person, place, and time.  He has normal reflexes. He exhibits normal muscle tone. Coordination normal.  Skin: Skin is warm and dry.  Psychiatric: He has a normal mood and affect. His behavior is normal. Judgment and thought content normal.    ED Course  Procedures (including critical care time) Labs Reviewed - No data to display No results found. No diagnosis found.  Date: 07/08/2012  Rate: 96  Rhythm: normal sinus rhythm  QRS Axis: normal  Intervals: QT prolonged  ST/T Wave abnormalities: nonspecific ST changes  Conduction Disutrbances:none  Narrative Interpretation:   Old EKG Reviewed: unchanged except rate from dec 2010   MDM  9:36 AM Patient states feels improved.  HR 99.  IV being started.  Awaiting cxr.   Potassium po ordered.   Patient presents today with generalized bodyaches and subjective fever.  No fever here, mild leukocytosis, tachycardia resolved with fluids, and hypokalemia- patient given oral replenishment.  Patient advised of results, given return precautions,  And advised regarding follow up.     Hilario Quarry, MD 07/08/12 [phone]

## 2012-07-08 NOTE — ED Notes (Addendum)
Presents with body aches and chills for 2 days after drinking a year old juice. Pt states, he has a fever but has not taken it with a thermometer, reports chills and fatigue, nausea, dizziness. HR 120s. deines chest pain and SOB

## 2012-07-08 NOTE — ED Notes (Signed)
3rd IV team member at bedside

## 2012-07-08 NOTE — ED Notes (Signed)
RN's attempted IV start with no success.  IV team paged.

## 2012-07-10 ENCOUNTER — Encounter: Payer: Self-pay | Admitting: Internal Medicine

## 2012-08-06 ENCOUNTER — Encounter: Payer: Self-pay | Admitting: *Deleted

## 2012-08-25 ENCOUNTER — Ambulatory Visit: Payer: No Typology Code available for payment source

## 2012-08-25 ENCOUNTER — Ambulatory Visit (INDEPENDENT_AMBULATORY_CARE_PROVIDER_SITE_OTHER): Payer: No Typology Code available for payment source | Admitting: Internal Medicine

## 2012-08-25 ENCOUNTER — Other Ambulatory Visit (INDEPENDENT_AMBULATORY_CARE_PROVIDER_SITE_OTHER): Payer: No Typology Code available for payment source

## 2012-08-25 ENCOUNTER — Encounter: Payer: Self-pay | Admitting: Internal Medicine

## 2012-08-25 VITALS — BP 132/82 | HR 96 | Ht 70.0 in | Wt 226.4 lb

## 2012-08-25 DIAGNOSIS — E876 Hypokalemia: Secondary | ICD-10-CM

## 2012-08-25 DIAGNOSIS — B182 Chronic viral hepatitis C: Secondary | ICD-10-CM

## 2012-08-25 DIAGNOSIS — Z23 Encounter for immunization: Secondary | ICD-10-CM

## 2012-08-25 LAB — POTASSIUM: Potassium: 4 mEq/L (ref 3.5–5.1)

## 2012-08-25 MED ORDER — PNEUMOCOCCAL VAC POLYVALENT 25 MCG/0.5ML IJ INJ: 0.5000 mL | INJECTION | Freq: Once | INTRAMUSCULAR | Status: DC

## 2012-08-25 NOTE — Progress Notes (Signed)
  Subjective:    Patient ID: Scott Burgess, male    DOB: Apr 23, 1950, 62 y.o.   MRN: [id number]  HPI The patient is here for follow-up of hepatitis C. He has no new c/o. He was in the ED recently with myalgias and had hypokalemia. He took a course of K and completed it. He continues to work in the [employer] at [employer]. Medications, allergies, past medical history, past surgical history, family history and social history are reviewed and updated in the EMR.   Review of Systems As above, some insomnia    Objective:   Physical Exam NAD Lungs are clear Heart S1S2 no rmg abd soft and non-tender, no HSM    Assessment & Plan:  Hypokalemia - Plan: Potassium  Hepatitis C, chronic - genotype 2 - Plan: pneumococcal 23 valent vaccine (PNU-IMMUNE) injection 0.5 mL  Need for prophylactic vaccination against Streptococcus pneumoniae (pneumococcus) - Plan: pneumococcal 23 valent vaccine (PNU-IMMUNE) injection 0.5 mL

## 2012-08-25 NOTE — Patient Instructions (Addendum)
Your physician has requested that you go to the basement for the following lab work before leaving today: Potassium level  Today you have been given a pneumovax vaccine, this is good for 5 years.  We will put in a recall to see you again in February 2015 for an office visit.   I appreciate the opportunity to care for you.

## 2012-08-25 NOTE — Progress Notes (Signed)
Quick Note:  Let him know potassium is ok  ______

## 2012-08-25 NOTE — Assessment & Plan Note (Signed)
Stable  Lab Results  Component Value Date   ALT 114* 07/08/2012   AST 61* 07/08/2012   ALKPHOS 65 07/08/2012   BILITOT 0.8 07/08/2012   Pneumococcal vaccine today REV about 6 months Try to get [place] and labs before No trials right now - he is [facility] so treatment of HCV not option right now - await trial vs. When he obtains insurance - said he did not qualify for "Obamacare" plan

## 2012-08-28 ENCOUNTER — Ambulatory Visit: Payer: No Typology Code available for payment source

## 2012-12-24 ENCOUNTER — Other Ambulatory Visit: Payer: Self-pay | Admitting: *Deleted

## 2012-12-24 MED ORDER — AMLODIPINE BESYLATE 5 MG PO TABS: 5.0000 mg | ORAL_TABLET | Freq: Every day | ORAL | Status: DC

## 2012-12-24 NOTE — Telephone Encounter (Signed)
Will refill this medication but patient needs to be in to see me.  Please schedule him for my next available appointment slot.

## 2013-01-16 ENCOUNTER — Ambulatory Visit: Payer: No Typology Code available for payment source

## 2013-02-20 ENCOUNTER — Encounter: Payer: Self-pay | Admitting: Internal Medicine

## 2013-03-19 ENCOUNTER — Encounter: Payer: Self-pay | Admitting: Internal Medicine

## 2013-03-19 ENCOUNTER — Ambulatory Visit (INDEPENDENT_AMBULATORY_CARE_PROVIDER_SITE_OTHER): Payer: No Typology Code available for payment source | Admitting: Internal Medicine

## 2013-03-19 VITALS — BP 138/82 | HR 92 | Temp 98.1°F | Ht 70.0 in | Wt 236.3 lb

## 2013-03-19 DIAGNOSIS — H5210 Myopia, unspecified eye: Secondary | ICD-10-CM

## 2013-03-19 DIAGNOSIS — N529 Male erectile dysfunction, unspecified: Secondary | ICD-10-CM

## 2013-03-19 DIAGNOSIS — G4700 Insomnia, unspecified: Secondary | ICD-10-CM

## 2013-03-19 DIAGNOSIS — Z23 Encounter for immunization: Secondary | ICD-10-CM

## 2013-03-19 DIAGNOSIS — I10 Essential (primary) hypertension: Secondary | ICD-10-CM

## 2013-03-19 DIAGNOSIS — E785 Hyperlipidemia, unspecified: Secondary | ICD-10-CM

## 2013-03-19 DIAGNOSIS — Z0000 Encounter for general adult medical examination without abnormal findings: Secondary | ICD-10-CM

## 2013-03-19 MED ORDER — SILDENAFIL CITRATE 50 MG PO TABS: 50.0000 mg | ORAL_TABLET | ORAL | Status: DC | PRN

## 2013-03-19 MED ORDER — ZOLPIDEM TARTRATE 5 MG PO TABS: 5.0000 mg | ORAL_TABLET | Freq: Every evening | ORAL | Status: DC | PRN

## 2013-03-19 MED ORDER — AMLODIPINE BESYLATE 5 MG PO TABS: 5.0000 mg | ORAL_TABLET | Freq: Every day | ORAL | Status: DC

## 2013-03-19 MED ORDER — SILDENAFIL CITRATE 50 MG PO TABS
50.0000 mg | ORAL_TABLET | ORAL | Status: DC | PRN
Start: 2013-03-19 — End: 2013-04-21

## 2013-03-19 MED ORDER — PRAVASTATIN SODIUM 20 MG PO TABS: 20.0000 mg | ORAL_TABLET | Freq: Every evening | ORAL | Status: DC

## 2013-03-19 NOTE — Assessment & Plan Note (Signed)
Not taking pravastatin due to concern it is causing insomnia.  I discussed this is unlikely related to pravastatin as it continues despite stopping pravastatin.  Encouraged resumption of pravastatin and if he needs to he can take in the AM as this will be better than not taking at all. -Defer lipid panel to next visit when taking pravastatin - Refill Pravastatin.

## 2013-03-19 NOTE — Assessment & Plan Note (Signed)
BP Readings from Last 3 Encounters:  03/19/13 138/82  08/25/12 132/82  07/08/12 150/85    Lab Results  Component Value Date   NA 136 07/08/2012   K 4.0 08/25/2012   CREATININE 1.05 07/08/2012    Assessment: Blood pressure control: controlled Progress toward BP goal:  at goal Comments:   Plan: Medications:  continue current medications Amlodipine 5mg  QD Educational resources provided:   Self management tools provided:   Other plans:

## 2013-03-19 NOTE — Assessment & Plan Note (Signed)
Last eye exam >4 years ago. Referral to optometry.

## 2013-03-19 NOTE — Progress Notes (Signed)
[facility] Subjective:   Patient ID: Scott Burgess male   DOB: Dec 12, 1950 63 y.o.   MRN: [id number]  HPI: Scott Burgess is a 63 y.o. male with a PMH significant for HTN, HLD, ED, Chronic Hep C. He reports overall he has been doing pretty well. He has been taking most of his medications as prescribed however he has not been taking pravastatin regularly because he attributes his insomnia to this medication.   As far as his insomnia goes he reports he has been taking [place] very sparingly with success.  He usually gets home from work around [place] he showers, watches TV then eats a meal both in his bedroom. He trieds to fall asleep around 7:30 to 8pm. He lives with 2 other men in a staff house and each have their own room but share a kitchen, living room and bath room.  He has blinds on the windows, some streetlight does get in through the window. The temperature is usually around 75 degrees and notes he is sometimes too warm to sleep and his roommates are cold natured.  He reports he usually tries to watch TV up until he is ready to sleep.  He has to get up at 5am each morning.  He averages no more than 5 hours of sleep when not taking Ambien. If he takes Ambien he gets at least 6 hours.    He is followed by Dr. Nita Sickle for his Hep C.  He cannot afford treatment at this time for Hep C.  Past Medical History  Diagnosis Date  . Hyperlipidemia   . Hypertension   . Hernia   . Pneumonia   . STD (male)   . Hepatitis C, chronic     genotype 2  . Personal history of colonic adenoma 03/17/2012   Current Outpatient Prescriptions  Medication Sig Dispense Refill  . amLODipine (NORVASC) 5 MG tablet Take 1 tablet (5 mg total) by mouth daily.  30 tablet  5  . aspirin 81 MG tablet Take 81 mg by mouth daily.      Marland Kitchen zolpidem (AMBIEN) 5 MG tablet Take 1 tablet (5 mg total) by mouth at bedtime as needed for sleep.  30 tablet  1  . Cholecalciferol (VITAMIN D3) 1000 UNITS CAPS Take 1  capsule by mouth daily.      . Multiple Vitamins-Minerals (CENTRUM SILVER ADULT 50+) TABS Take 1 tablet by mouth daily.      . pravastatin (PRAVACHOL) 20 MG tablet Take 1 tablet (20 mg total) by mouth every evening.  30 tablet  5  . sildenafil (VIAGRA) 50 MG tablet Take 1 tablet (50 mg total) by mouth as needed for erectile dysfunction.  30 tablet  1   Current Facility-Administered Medications  Medication Dose Route Frequency Provider Last Rate Last Dose  . pneumococcal 23 valent vaccine (PNU-IMMUNE) injection 0.5 mL  0.5 mL Intramuscular Once Gatha Mayer, MD       Family History  Problem Relation Age of Onset  . Diabetes Mother   . Diabetes Maternal Grandmother    History   Social History  . Marital Status: Single    Spouse Name: N/A    Number of Children: 3  . Years of Education: N/A   Occupational History  .     Social History Main Topics  . Smoking status: Former Smoker    Quit date: 01/15/2005  . Smokeless tobacco: Never Used  . Alcohol Use: No  . Drug Use: No  .  Sexual Activity: None   Other Topics Concern  . None   Social History Narrative  . None   Review of Systems: Review of Systems  Constitutional: Positive for malaise/fatigue. Negative for fever, chills, weight loss and diaphoresis.  HENT: Negative for congestion and sore throat.   Eyes: Negative for blurred vision.       Thinks his prescription glasses need to be upgraded last eye exam >4 years ago  Respiratory: Negative for cough and shortness of breath.   Cardiovascular: Negative for chest pain and leg swelling.  Gastrointestinal: Negative for heartburn, nausea, vomiting, abdominal pain, blood in stool and melena.  Genitourinary: Negative for dysuria.  Musculoskeletal: Positive for joint pain (right knee chronic).  Neurological: Negative for dizziness, weakness and headaches.  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 The patient is not  nervous/anxious.      Objective:  Physical Exam: Filed Vitals:   03/19/13 1357  BP: 140/81  Pulse: 92  Temp: 98.1 F (36.7 C)  TempSrc: Oral  Height: 5\' 10"  (1.778 m)  Weight: 236 lb 4.8 oz (107.185 kg)  SpO2: 95%  Physical Exam  Nursing note and vitals reviewed. Constitutional: He is well-developed, well-nourished, and in no distress. No distress.  HENT:  Head: Normocephalic and atraumatic.  Cardiovascular: Normal rate, regular rhythm, normal heart sounds and intact distal pulses.   No murmur heard. Pulmonary/Chest: Effort normal and breath sounds normal. No respiratory distress. He has no wheezes.  Abdominal: Soft. Bowel sounds are normal.  Skin: He is not diaphoretic.    Assessment & Plan:  Case discussed with Dr. Eppie Gibson See Problem Based Assessment and Plan Medications Ordered Meds ordered this encounter  Medications  . aspirin 81 MG tablet    Sig: Take 81 mg by mouth daily.  Marland Kitchen amLODipine (NORVASC) 5 MG tablet    Sig: Take 1 tablet (5 mg total) by mouth daily.    Dispense:  30 tablet    Refill:  5  . pravastatin (PRAVACHOL) 20 MG tablet    Sig: Take 1 tablet (20 mg total) by mouth every evening.    Dispense:  30 tablet    Refill:  5  . DISCONTD: sildenafil (VIAGRA) 50 MG tablet    Sig: Take 1 tablet (50 mg total) by mouth as needed for erectile dysfunction.    Dispense:  30 tablet    Refill:  1  . sildenafil (VIAGRA) 50 MG tablet    Sig: Take 1 tablet (50 mg total) by mouth as needed for erectile dysfunction.    Dispense:  30 tablet    Refill:  1  . DISCONTD: zolpidem (AMBIEN) 5 MG tablet    Sig: Take 1 tablet (5 mg total) by mouth at bedtime as needed for sleep.    Dispense:  30 tablet    Refill:  1  . zolpidem (AMBIEN) 5 MG tablet    Sig: Take 1 tablet (5 mg total) by mouth at bedtime as needed for sleep.    Dispense:  30 tablet    Refill:  1   Other Orders Orders Placed This Encounter  Procedures  . Ambulatory referral to Optometry    Referral  Priority:  Routine    Referral Type:  Vision Transport planner)    Referral Reason:  Specialty Services Required    Requested Specialty:  Optometry    Number of Visits Requested:  1

## 2013-03-19 NOTE — Assessment & Plan Note (Signed)
Flu vaccine given today. Recheck Lipids at next visit (not checked today as patient not taking pravastatin)

## 2013-03-19 NOTE — Progress Notes (Signed)
Case discussed with Dr. Hoffman at the time of the visit.  We reviewed the resident's history and exam and pertinent patient test results.  I agree with the assessment, diagnosis and plan of care documented in the resident's note. 

## 2013-03-19 NOTE — Patient Instructions (Addendum)
I have refilled your medications, please continue taking them as prescribed. I do not think Pravastatin is the cause of your insomnia, please continue to take, it will be less effective but you can take it in the morning if you think it will help. We will try go get you an appointment with Optometry.   Practice Good Sleep Hygiene Here are some suggestions Avoid napping during the day. It can disturb the normal pattern of sleep and wakefulness.  Avoid stimulants such as caffeine, nicotine, and alcohol too close to bedtime. While alcohol is well known to speed the onset of sleep, it disrupts sleep in the second half as the body begins to metabolize the alcohol, causing arousal.  Exercise can promote good sleep. Vigorous exercise should be taken in the morning or late afternoon. A relaxing exercise, like yoga, can be done before bed to help initiate a restful night's sleep. Food can be disruptive right before sleep. Stay away from large meals close to bedtime. Also dietary changes can cause sleep problems, if someone is struggling with a sleep problem, it's not a good time to start experimenting with spicy dishes. And, remember, chocolate has caffeine.  Ensure adequate exposure to natural light. This is particularly important for older people who may not venture outside as frequently as children and adults. Light exposure helps maintain a healthy sleep-wake cycle.  Establish a regular relaxing bedtime routine. Try to avoid emotionally upsetting conversations and activities before trying to go to sleep. Don't dwell on, or bring your problems to bed.  Associate your bed with sleep. It's not a good idea to use your bed to watch TV, listen to the radio, or read.  Make sure that the sleep environment is pleasant and relaxing. The bed should be comfortable, the room should not be too hot or cold, or too bright.

## 2013-03-19 NOTE — Assessment & Plan Note (Signed)
Discussed critical importance of sleep hygiene. Given multiple suggestions to improve sleep hygiene including setting a bedtime, no TV or eating 2 hours prior to bedtime.  No watching TV in bed or if cannot sleep.  Adding curtains to make room darker.  Possibly decreasing room temperature. -Given refill of Ambien for short term aid to help adjust sleep cycle.

## 2013-03-19 NOTE — Assessment & Plan Note (Signed)
Doing well with Viagra. Refill given.

## 2013-04-21 ENCOUNTER — Other Ambulatory Visit: Payer: Self-pay | Admitting: *Deleted

## 2013-04-21 DIAGNOSIS — N529 Male erectile dysfunction, unspecified: Secondary | ICD-10-CM

## 2013-04-21 MED ORDER — SILDENAFIL CITRATE 50 MG PO TABS: 50.0000 mg | ORAL_TABLET | ORAL | Status: DC | PRN

## 2013-04-21 NOTE — Telephone Encounter (Signed)
This Rx was sent to wrong pharmacy.  Please resend to [facility]. Did you want # 30?

## 2013-04-29 ENCOUNTER — Encounter: Payer: Self-pay | Admitting: Internal Medicine

## 2013-06-18 ENCOUNTER — Other Ambulatory Visit (INDEPENDENT_AMBULATORY_CARE_PROVIDER_SITE_OTHER): Payer: No Typology Code available for payment source

## 2013-06-18 ENCOUNTER — Encounter: Payer: Self-pay | Admitting: Internal Medicine

## 2013-06-18 ENCOUNTER — Ambulatory Visit (INDEPENDENT_AMBULATORY_CARE_PROVIDER_SITE_OTHER): Payer: No Typology Code available for payment source | Admitting: Internal Medicine

## 2013-06-18 VITALS — BP 132/80 | HR 80 | Ht 70.0 in | Wt 233.6 lb

## 2013-06-18 DIAGNOSIS — B182 Chronic viral hepatitis C: Secondary | ICD-10-CM

## 2013-06-18 LAB — COMPREHENSIVE METABOLIC PANEL
ALBUMIN: 4 g/dL (ref 3.5–5.2)
ALK PHOS: 56 U/L (ref 39–117)
ALT: 114 U/L — ABNORMAL HIGH (ref 0–53)
AST: 71 U/L — ABNORMAL HIGH (ref 0–37)
BILIRUBIN TOTAL: 0.7 mg/dL (ref 0.2–1.2)
BUN: 14 mg/dL (ref 6–23)
CO2: 29 meq/L (ref 19–32)
Calcium: 9.8 mg/dL (ref 8.4–10.5)
Chloride: 107 mEq/L (ref 96–112)
Creatinine, Ser: 0.9 mg/dL (ref 0.4–1.5)
GFR: 111.33 mL/min (ref >60.00)
GLUCOSE: 100 mg/dL — AB (ref 70–99)
POTASSIUM: 4.1 meq/L (ref 3.5–5.1)
SODIUM: 140 meq/L (ref 135–145)
TOTAL PROTEIN: 7.7 g/dL (ref 6.0–8.3)

## 2013-06-18 NOTE — Assessment & Plan Note (Signed)
Stable overall CMET today and check into trials/Tx options

## 2013-06-18 NOTE — Progress Notes (Signed)
         Subjective:    Patient ID: Scott Burgess, male    DOB: [birth year], 63 y.o.   MRN: [id number]  HPI Here for f/u HCV Still on [facility] and no Rx plan No c/o Preparing for a vacation cruise - gift from daughter  Medications, allergies, past medical history, past surgical history, family history and social history are reviewed and updated in the EMR.  Review of Systems neg    Objective:   Physical Exam General:  NAD Eyes:   anicteric Lungs:  clear Heart:  S1S2 no rubs, murmurs or gallops Abdomen:  soft and nontender, BS+ Ext:   no edema    Assessment & Plan:  Hepatitis C, chronic - genotype 2 Stable overall CMET today and check into trials/Tx options

## 2013-06-18 NOTE — Patient Instructions (Signed)
Your physician has requested that you go to the basement for the following lab work before leaving today: CMET  I appreciate the opportunity to care for you.  

## 2013-07-02 NOTE — Progress Notes (Signed)
Quick Note:  Let him know that no treatment trials or free HCV Tx right now but maybe by next year. He should see me again in 6 months - please place a recall ______

## 2013-09-28 ENCOUNTER — Other Ambulatory Visit: Payer: Self-pay | Admitting: Internal Medicine

## 2013-11-03 ENCOUNTER — Encounter: Payer: Self-pay | Admitting: Internal Medicine

## 2014-01-05 ENCOUNTER — Ambulatory Visit: Payer: No Typology Code available for payment source

## 2014-01-12 ENCOUNTER — Ambulatory Visit: Payer: No Typology Code available for payment source

## 2014-01-21 ENCOUNTER — Encounter: Payer: Self-pay | Admitting: Internal Medicine

## 2014-02-11 ENCOUNTER — Ambulatory Visit (INDEPENDENT_AMBULATORY_CARE_PROVIDER_SITE_OTHER): Payer: Self-pay | Admitting: Internal Medicine

## 2014-02-11 ENCOUNTER — Encounter: Payer: Self-pay | Admitting: Internal Medicine

## 2014-02-11 VITALS — BP 144/74 | HR 85 | Temp 98.1°F | Ht 70.0 in | Wt 236.9 lb

## 2014-02-11 DIAGNOSIS — Z7982 Long term (current) use of aspirin: Secondary | ICD-10-CM

## 2014-02-11 DIAGNOSIS — M25561 Pain in right knee: Secondary | ICD-10-CM

## 2014-02-11 DIAGNOSIS — I10 Essential (primary) hypertension: Secondary | ICD-10-CM

## 2014-02-11 DIAGNOSIS — G4700 Insomnia, unspecified: Secondary | ICD-10-CM

## 2014-02-11 DIAGNOSIS — N529 Male erectile dysfunction, unspecified: Secondary | ICD-10-CM

## 2014-02-11 DIAGNOSIS — M170 Bilateral primary osteoarthritis of knee: Secondary | ICD-10-CM | POA: Insufficient documentation

## 2014-02-11 DIAGNOSIS — M1711 Unilateral primary osteoarthritis, right knee: Secondary | ICD-10-CM

## 2014-02-11 DIAGNOSIS — E785 Hyperlipidemia, unspecified: Secondary | ICD-10-CM

## 2014-02-11 HISTORY — DX: Unilateral primary osteoarthritis, right knee: M17.11

## 2014-02-11 MED ORDER — ZOLPIDEM TARTRATE 5 MG PO TABS: 5.0000 mg | ORAL_TABLET | Freq: Every evening | ORAL | Status: DC | PRN

## 2014-02-11 MED ORDER — PRAVASTATIN SODIUM 10 MG PO TABS: 10.0000 mg | ORAL_TABLET | Freq: Every evening | ORAL | Status: DC

## 2014-02-11 MED ORDER — AMLODIPINE BESYLATE 2.5 MG PO TABS: 2.5000 mg | ORAL_TABLET | Freq: Every day | ORAL | Status: DC

## 2014-02-11 NOTE — Assessment & Plan Note (Signed)
-   He does have a very mild dyslipidemia, of more concern is his 10 year CVD risk score of 10-20%.  He however has not been taking his moderate intensity statin because he is concerned it may effect his sleep. - Will have him reduce Pravastatin to 10mg  (would rather this than nothing).  If he tolerates well we can see about increasing it back in the future.

## 2014-02-11 NOTE — Assessment & Plan Note (Signed)
-  Reports having some trouble obtaining his Rx for Viagra from [place].  I asked him to call them back to find out what is going on.

## 2014-02-11 NOTE — Patient Instructions (Signed)
General Instructions: I want you to start taking 2.5mg  of amlodipine daily and 10mg  of pravastatin.  You can take 2 Ibuprofen every 6 hours as needed for knee pain.  Please bring your medicines with you each time you come to clinic.  Medicines may include prescription medications, over-the-counter medications, herbal remedies, eye drops, vitamins, or other pills.   Progress Toward Treatment Goals:  Treatment Goal 03/19/2013  Blood pressure at goal    Self Care Goals & Plans:  Self Care Goal 03/19/2013  Manage my medications take my medicines as prescribed; bring my medications to every visit  Monitor my health -  Eat healthy foods eat more vegetables; eat foods that are low in salt  Be physically active find an activity I enjoy; take a walk every day    No flowsheet data found.   Care Management & Community Referrals:  Referral 03/19/2013  Referrals made for care management support none needed  Referrals made to community resources -        Practice Good Sleep Hygiene Here are some suggestions Avoid napping during the day. It can disturb the normal pattern of sleep and wakefulness.  Avoid stimulants such as caffeine, nicotine, and alcohol too close to bedtime. While alcohol is well known to speed the onset of sleep, it disrupts sleep in the second half as the body begins to metabolize the alcohol, causing arousal.  Exercise can promote good sleep. Vigorous exercise should be taken in the morning or late afternoon. A relaxing exercise, like yoga, can be done before bed to help initiate a restful night's sleep. Food can be disruptive right before sleep. Stay away from large meals close to bedtime. Also dietary changes can cause sleep problems, if someone is struggling with a sleep problem, it's not a good time to start experimenting with spicy dishes. And, remember, chocolate has caffeine.  Ensure adequate exposure to natural light. This is particularly important for older people who  may not venture outside as frequently as children and adults. Light exposure helps maintain a healthy sleep-wake cycle.  Establish a regular relaxing bedtime routine. Try to avoid emotionally upsetting conversations and activities before trying to go to sleep. Don't dwell on, or bring your problems to bed.  Associate your bed with sleep. It's not a good idea to use your bed to watch TV, listen to the radio, or read.  Make sure that the sleep environment is pleasant and relaxing. The bed should be comfortable, the room should not be too hot or cold, or too bright.

## 2014-02-11 NOTE — Progress Notes (Signed)
[name] Subjective:   Patient ID: Scott Burgess male   DOB: 02-12-1950 64 y.o.   MRN: [id number]  HPI: [name] is a 64 y.o. male with a PMH significant for HTN, HLD, ED, Chronic Hep C. Please see A&P below for chronic medical issue HPI.  Of note he reports his brother recently passed away after ICU stay, he wants information about Advanced directive (given) and HCPOA(given) he notes that he would like his second child Scott Burgess to be his HCPOA.  He reports he has also been interested in creating a will and will bring this up at that time too.  He is followed by Dr. Nita Sickle for his Hep C.  He cannot afford treatment at this time for Hep C.  Past Medical History  Diagnosis Date  . Hyperlipidemia   . Hypertension   . Hernia   . Pneumonia   . STD (male)   . Hepatitis C, chronic     genotype 2  . Personal history of colonic adenoma 03/17/2012   Current Outpatient Prescriptions  Medication Sig Dispense Refill  . aspirin 81 MG tablet Take 162 mg by mouth daily.     Marland Kitchen zolpidem (AMBIEN) 5 MG tablet Take 1 tablet (5 mg total) by mouth at bedtime as needed for sleep. 30 tablet 1  . amLODipine (NORVASC) 2.5 MG tablet Take 1 tablet (2.5 mg total) by mouth daily. 30 tablet 5  . Cholecalciferol (VITAMIN D3) 1000 UNITS CAPS Take 1 capsule by mouth daily.    . Multiple Vitamins-Minerals (CENTRUM SILVER ADULT 50+) TABS Take 1 tablet by mouth daily.    . pravastatin (PRAVACHOL) 10 MG tablet Take 1 tablet (10 mg total) by mouth every evening. 30 tablet 11  . sildenafil (VIAGRA) 50 MG tablet Take 1 tablet (50 mg total) by mouth as needed for erectile dysfunction. (Patient not taking: Reported on 02/11/2014) 30 tablet 1   Current Facility-Administered Medications  Medication Dose Route Frequency Provider Last Rate Last Dose  . pneumococcal 23 valent vaccine (PNU-IMMUNE) injection 0.5 mL  0.5 mL Intramuscular Once Gatha Mayer, MD       Family History  Problem  Relation Age of Onset  . Diabetes Mother   . Diabetes Maternal Grandmother    History   Social History  . Marital Status: Single    Spouse Name: N/A    Number of Children: 3  . Years of Education: N/A   Occupational History  .    . cook    Social History Main Topics  . Smoking status: Former Smoker    Quit date: 01/15/2005  . Smokeless tobacco: Never Used  . Alcohol Use: No  . Drug Use: No  . Sexual Activity: None   Other Topics Concern  . None   Social History Narrative   Review of Systems: Review of Systems  Constitutional: Negative for fever and chills.  Eyes: Negative for blurred vision.  Respiratory: Negative for cough and shortness of breath.   Cardiovascular: Negative for chest pain.  Gastrointestinal: Negative for abdominal pain.  Musculoskeletal: Positive for joint pain. Negative for back pain.  Neurological: Negative for dizziness and headaches.  Psychiatric/Behavioral: The patient has insomnia.      Objective:  Physical Exam: Filed Vitals:   02/11/14 1540  BP: 144/74  Pulse: 85  Temp: 98.1 F (36.7 C)  TempSrc: Oral  Height: 5\' 10"  (1.778 m)  Weight: 236 lb 14.4 oz (107.457 kg)  SpO2: 98%  Physical Exam  Constitutional: He is well-developed, well-nourished, and in no distress.  HENT:  Head: Normocephalic and atraumatic.  Cardiovascular: Normal rate, regular rhythm and normal heart sounds.   Pulmonary/Chest: Effort normal and breath sounds normal. He has no wheezes.  Abdominal: Soft. Bowel sounds are normal.  Musculoskeletal:       Right knee: He exhibits normal range of motion, no swelling, no effusion, no LCL laxity, normal patellar mobility, normal meniscus and no MCL laxity. No tenderness found.       Left knee: He exhibits normal range of motion, no swelling and no effusion. No tenderness found.  Nursing note and vitals reviewed.   Assessment & Plan:  Case discussed with Dr. Eppie Gibson   Erectile dysfunction -Reports having some trouble  obtaining his Rx for Viagra from [place].  I asked him to call them back to find out what is going on.   Insomnia - Improved.  Patient still taking Ambein very sparingly.  Although typically is able to get a good night sleep when taking Ambien.   He has made some changes by limiting caffeine intake and TV before bed. - Refill Ambien. - Reviewed Sleep hygiene again.   Dyslipidemia - He does have a very mild dyslipidemia, of more concern is his 10 year CVD risk score of 10-20%.  He however has not been taking his moderate intensity statin because he is concerned it may effect his sleep. - Will have him reduce Pravastatin to 10mg  (would rather this than nothing).  If he tolerates well we can see about increasing it back in the future.   Essential hypertension BP Readings from Last 3 Encounters:  02/11/14 144/74  06/18/13 132/80  03/19/13 138/82    Lab Results  Component Value Date   NA 140 06/18/2013   K 4.1 06/18/2013   CREATININE 0.9 06/18/2013   Patient reports he has stopped all of his medication besides ASA and ambien,  He is concerned that his BP and Cholesterol medications may interfere with his sleep.  Assessment: Blood pressure control: mildly elevated Progress toward BP goal:  deteriorated Comments: not taking medication  Plan: Medications:  Reduce Amlodipine to 2.5mg  daily Educational resources provided:   Self management tools provided:   Other plans: Advised exercise and diet to further reduce need for BP medication to come off altogether.    Right knee pain Reports he has had right knee pain for a long time, has been worse lately in could weather. Does not impact he daily activities.  No problems moving joint.  No recent trauma but had a sports injury many years ago, also notes the had arthroscopy on that joint in the past.  He feels this may be arthritits. - Exam of joint is benign.   -Suspect OA. - Will start with OTC Ibuprofen.      Medications  Ordered Meds ordered this encounter  Medications  . zolpidem (AMBIEN) 5 MG tablet    Sig: Take 1 tablet (5 mg total) by mouth at bedtime as needed for sleep.    Dispense:  30 tablet    Refill:  1  . amLODipine (NORVASC) 2.5 MG tablet    Sig: Take 1 tablet (2.5 mg total) by mouth daily.    Dispense:  30 tablet    Refill:  5  . pravastatin (PRAVACHOL) 10 MG tablet    Sig: Take 1 tablet (10 mg total) by mouth every evening.    Dispense:  30 tablet    Refill:  11  Other Orders No orders of the defined types were placed in this encounter.

## 2014-02-11 NOTE — Assessment & Plan Note (Signed)
-   Improved.  Patient still taking Ambein very sparingly.  Although typically is able to get a good night sleep when taking Ambien.   He has made some changes by limiting caffeine intake and TV before bed. - Refill Ambien. - Reviewed Sleep hygiene again.

## 2014-02-11 NOTE — Assessment & Plan Note (Signed)
Reports he has had right knee pain for a long time, has been worse lately in could weather. Does not impact he daily activities.  No problems moving joint.  No recent trauma but had a sports injury many years ago, also notes the had arthroscopy on that joint in the past.  He feels this may be arthritits. - Exam of joint is benign.   -Suspect OA. - Will start with OTC Ibuprofen.

## 2014-02-11 NOTE — Assessment & Plan Note (Signed)
BP Readings from Last 3 Encounters:  02/11/14 144/74  06/18/13 132/80  03/19/13 138/82    Lab Results  Component Value Date   NA 140 06/18/2013   K 4.1 06/18/2013   CREATININE 0.9 06/18/2013   Patient reports he has stopped all of his medication besides ASA and ambien,  He is concerned that his BP and Cholesterol medications may interfere with his sleep.  Assessment: Blood pressure control: mildly elevated Progress toward BP goal:  deteriorated Comments: not taking medication  Plan: Medications:  Reduce Amlodipine to 2.5mg  daily Educational resources provided:   Self management tools provided:   Other plans: Advised exercise and diet to further reduce need for BP medication to come off altogether.

## 2014-02-20 NOTE — Progress Notes (Signed)
Case discussed with Dr. Hoffman soon after the resident saw the patient.  We reviewed the resident's history and exam and pertinent patient test results.  I agree with the assessment, diagnosis, and plan of care documented in the resident's note. 

## 2014-04-27 ENCOUNTER — Other Ambulatory Visit: Payer: Self-pay | Admitting: *Deleted

## 2014-04-28 MED ORDER — SILDENAFIL CITRATE 50 MG PO TABS: 50.0000 mg | ORAL_TABLET | ORAL | Status: DC | PRN

## 2014-05-13 ENCOUNTER — Ambulatory Visit (INDEPENDENT_AMBULATORY_CARE_PROVIDER_SITE_OTHER): Payer: Self-pay | Admitting: Internal Medicine

## 2014-05-13 ENCOUNTER — Encounter: Payer: Self-pay | Admitting: Internal Medicine

## 2014-05-13 VITALS — BP 158/82 | HR 86 | Temp 98.5°F | Ht 70.0 in | Wt 227.8 lb

## 2014-05-13 DIAGNOSIS — E785 Hyperlipidemia, unspecified: Secondary | ICD-10-CM

## 2014-05-13 DIAGNOSIS — I10 Essential (primary) hypertension: Secondary | ICD-10-CM

## 2014-05-13 DIAGNOSIS — M1711 Unilateral primary osteoarthritis, right knee: Secondary | ICD-10-CM

## 2014-05-13 DIAGNOSIS — M1731 Unilateral post-traumatic osteoarthritis, right knee: Secondary | ICD-10-CM

## 2014-05-13 DIAGNOSIS — G4700 Insomnia, unspecified: Secondary | ICD-10-CM

## 2014-05-13 LAB — BASIC METABOLIC PANEL WITH GFR
BUN: 23 mg/dL (ref 6–23)
CALCIUM: 9.6 mg/dL (ref 8.4–10.5)
CO2: 24 meq/L (ref 19–32)
CREATININE: 0.91 mg/dL (ref 0.50–1.35)
Chloride: 105 mEq/L (ref 96–112)
GFR, EST NON AFRICAN AMERICAN: 89 mL/min
Glucose, Bld: 91 mg/dL (ref 70–99)
Potassium: 3.5 mEq/L (ref 3.5–5.3)
SODIUM: 138 meq/L (ref 135–145)

## 2014-05-13 MED ORDER — AMLODIPINE BESYLATE 5 MG PO TABS: 5.0000 mg | ORAL_TABLET | Freq: Every day | ORAL | Status: DC

## 2014-05-13 MED ORDER — SILDENAFIL CITRATE 50 MG PO TABS: 50.0000 mg | ORAL_TABLET | ORAL | Status: DC | PRN

## 2014-05-13 MED ORDER — IBUPROFEN 800 MG PO TABS: 800.0000 mg | ORAL_TABLET | Freq: Three times a day (TID) | ORAL | Status: DC | PRN

## 2014-05-13 NOTE — Progress Notes (Signed)
[name] Subjective:   Patient ID: Scott Burgess male   DOB: 04-01-1950 64 y.o.   MRN: [id number]  HPI: [name] is a 64 y.o. male with a PMH detailed below who presents for 3 month follow up of his HTN.  Please see assessment and plan below for further details about patient's chronic medical issues.    Past Medical History  Diagnosis Date  . Hyperlipidemia   . Hypertension   . Hernia   . Pneumonia   . STD (male)   . Hepatitis C, chronic     genotype 2  . Personal history of colonic adenoma 03/17/2012  . Osteoarthritis of right knee 02/11/2014   Current Outpatient Prescriptions  Medication Sig Dispense Refill  . amLODipine (NORVASC) 5 MG tablet Take 1 tablet (5 mg total) by mouth daily. 30 tablet 11  . aspirin 81 MG tablet Take 162 mg by mouth daily.     . pravastatin (PRAVACHOL) 10 MG tablet Take 1 tablet (10 mg total) by mouth every evening. 30 tablet 11  . zolpidem (AMBIEN) 5 MG tablet Take 1 tablet (5 mg total) by mouth at bedtime as needed for sleep. 30 tablet 1  . Cholecalciferol (VITAMIN D3) 1000 UNITS CAPS Take 1 capsule by mouth daily.    Marland Kitchen ibuprofen (ADVIL,MOTRIN) 800 MG tablet Take 1 tablet (800 mg total) by mouth every 8 (eight) hours as needed. 90 tablet 3  . Multiple Vitamins-Minerals (CENTRUM SILVER ADULT 50+) TABS Take 1 tablet by mouth daily.    . sildenafil (VIAGRA) 50 MG tablet Take 1 tablet (50 mg total) by mouth as needed for erectile dysfunction. 30 tablet 3   Current Facility-Administered Medications  Medication Dose Route Frequency Provider Last Rate Last Dose  . pneumococcal 23 valent vaccine (PNU-IMMUNE) injection 0.5 mL  0.5 mL Intramuscular Once Gatha Mayer, MD       Family History  Problem Relation Age of Onset  . Diabetes Mother   . Diabetes Maternal Grandmother    History   Social History  . Marital Status: Single    Spouse Name: N/A  . Number of Children: 3  . Years of Education: N/A   Occupational  History  .    . cook    Social History Main Topics  . Smoking status: Former Smoker    Quit date: 01/15/2005  . Smokeless tobacco: Never Used  . Alcohol Use: No  . Drug Use: No  . Sexual Activity: Not on file   Other Topics Concern  . None   Social History Narrative   Review of Systems: Review of Systems  Constitutional: Negative for fever, chills and weight loss.  Eyes: Negative for blurred vision.  Respiratory: Negative for cough and shortness of breath.   Cardiovascular: Negative for chest pain and leg swelling.  Gastrointestinal: Negative for heartburn, abdominal pain, diarrhea and constipation.  Musculoskeletal: Positive for joint pain. Negative for back pain.  Skin: Negative for rash.  Neurological: Negative for dizziness and headaches.  Psychiatric/Behavioral: Negative for depression and suicidal ideas.     Objective:  Physical Exam: Filed Vitals:   05/13/14 1532 05/13/14 1556  BP: 168/78 158/82  Pulse: 86   Temp: 98.5 F (36.9 C)   TempSrc: Oral   Height: 5\' 10"  (1.778 m)   Weight: 227 lb 12.8 oz (103.329 kg)   SpO2: 96%    Physical Exam  Constitutional: He is well-developed, well-nourished, and in no distress.  HENT:  Head: Normocephalic and atraumatic.  Cardiovascular: Normal rate, regular rhythm and normal heart sounds.   Pulmonary/Chest: Effort normal and breath sounds normal.  Abdominal: Soft. Bowel sounds are normal.  Musculoskeletal:       Right knee: He exhibits effusion (small). He exhibits no LCL laxity, normal patellar mobility, normal meniscus and no MCL laxity. Tenderness found. Medial joint line tenderness noted. No lateral joint line, no MCL and no LCL tenderness noted.       Left knee: He exhibits normal range of motion, no swelling, no effusion, normal alignment, no LCL laxity, no bony tenderness, normal meniscus and no MCL laxity. No tenderness found. No medial joint line and no lateral joint line tenderness noted.  Psychiatric: Affect  normal.  Nursing note and vitals reviewed.    Assessment & Plan:  Case discussed with Dr. Eppie Gibson  Osteoarthritis of right knee He continues to report knee pain today, he describes it as 4/10.      Medications Ordered Meds ordered this encounter  Medications  . DISCONTD: ibuprofen (ADVIL,MOTRIN) 200 MG tablet    Sig: Take 400 mg by mouth 2 (two) times daily.  . sildenafil (VIAGRA) 50 MG tablet    Sig: Take 1 tablet (50 mg total) by mouth as needed for erectile dysfunction.    Dispense:  30 tablet    Refill:  3  . amLODipine (NORVASC) 5 MG tablet    Sig: Take 1 tablet (5 mg total) by mouth daily.    Dispense:  30 tablet    Refill:  11  . ibuprofen (ADVIL,MOTRIN) 800 MG tablet    Sig: Take 1 tablet (800 mg total) by mouth every 8 (eight) hours as needed.    Dispense:  90 tablet    Refill:  3   Other Orders Orders Placed This Encounter  Procedures  . BMP with Estimated GFR ([id number])     PROCEDURE NOTE  PROCEDURE: right knee joint steroid injection.  PREOPERATIVE DIAGNOSIS: Osteoarthritis of the right knee.  POSTOPERATIVE DIAGNOSIS: Osteoarthritis of the right knee.  PROCEDURE: The patient was apprised of the risks and the benefits of the procedure and informed consent was obtained, as witnessed by Lela. Time-out procedure was performed, with confirmation of the patient's name, date of birth, and correct identification of the right knee to be injected. The patient's knee was then marked at the appropriate site for injection placement. The knee was sterilely prepped with Betadine. A 40 mg (1 milliliter) solution of Kenalog was drawn up into a 2.5 mL syringe with a 1 mL of 1% lidocaine. The patient was injected with a 27-gauge needle at the lateral aspect of his right flexed knee. There were no complications. The patient tolerated the procedure well. There was minimal bleeding. The patient was instructed to ice his knee upon leaving clinic and refrain from overuse over the  next 3 days. The patient was instructed to go to the emergency room with any usual pain, swelling, or redness occurred in the injected area. The patient was given a followup appointment to evaluate response to the injection to his increased range of motion and reduction of pain.  The procedure was supervised by attending physician, Dr. Eppie Gibson.
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
## 2014-05-13 NOTE — Patient Instructions (Signed)
General Instructions:  Please start taking 5mg  of amlodipine.  Will see you in 3 months.  Please bring your medicines with you each time you come to clinic.  Medicines may include prescription medications, over-the-counter medications, herbal remedies, eye drops, vitamins, or other pills.   Progress Toward Treatment Goals:  Treatment Goal 05/13/2014  Blood pressure deteriorated    Self Care Goals & Plans:  Self Care Goal 02/11/2014  Manage my medications take my medicines as prescribed; bring my medications to every visit; refill my medications on time  Monitor my health -  Eat healthy foods eat more vegetables; eat foods that are low in salt; eat baked foods instead of fried foods  Be physically active find an activity I enjoy; take a walk every day    No flowsheet data found.   Care Management & Community Referrals:  Referral 05/13/2014  Referrals made for care management support none needed  Referrals made to community resources -       Knee Injection Joint injections are shots. Your caregiver will place a needle into your knee joint. The needle is used to put medicine into the joint. These shots can be used to help treat different painful knee conditions such as osteoarthritis, bursitis, local flare-ups of rheumatoid arthritis, and pseudogout. Anti-inflammatory medicines such as corticosteroids and anesthetics are the most common medicines used for joint and soft tissue injections.  PROCEDURE  The skin over the kneecap will be cleaned with an antiseptic solution.  Your caregiver will inject a small amount of a local anesthetic (a medicine like Novocaine) just under the skin in the area that was cleaned.  After the area becomes numb, a second injection is done. This second injection usually includes an anesthetic and an anti-inflammatory medicine called a steroid or cortisone. The needle is carefully placed in between the kneecap and the knee, and the medicine is injected  into the joint space.  After the injection is done, the needle is removed. Your caregiver may place a bandage over the injection site. The whole procedure takes no more than a couple of minutes. BEFORE THE PROCEDURE  Wash all of the skin around the entire knee area. Try to remove any loose, scaling skin. There is no other specific preparation necessary unless advised otherwise by your caregiver. LET YOUR CAREGIVER KNOW ABOUT:   Allergies.  Medications taken including herbs, eye drops, over the counter medications, and creams.  Use of steroids (by mouth or creams).  Possible pregnancy, if applicable.  Previous problems with anesthetics or Novocaine.  History of blood clots (thrombophlebitis).  History of bleeding or blood problems.  Previous surgery.  Other health problems. RISKS AND COMPLICATIONS Side effects from cortisone shots are rare. They include:   Slight bruising of the skin.  Shrinkage of the normal fatty tissue under the skin where the shot was given.  Increase in pain after the shot.  Infection.  Weakening of tendons or tendon rupture.  Allergic reaction to the medicine.  Diabetics may have a temporary increase in their blood sugar after a shot.  Cortisone can temporarily weaken the immune system. While receiving these shots, you should not get certain vaccines. Also, avoid contact with anyone who has chickenpox or measles. Especially if you have never had these diseases or have not been previously immunized. Your immune system may not be strong enough to fight off the infection while the cortisone is in your system. AFTER THE PROCEDURE   You can go home after the procedure.  You may need to put ice on the joint 15-20 minutes every 3 or 4 hours until the pain goes away.  You may need to put an elastic bandage on the joint. HOME CARE INSTRUCTIONS   Only take over-the-counter or prescription medicines for pain, discomfort, or fever as directed by your  caregiver.  You should avoid stressing the joint. Unless advised otherwise, avoid activities that put a lot of pressure on a knee joint, such as:  Jogging.  Bicycling.  Recreational climbing.  Hiking.  Laying down and elevating the leg/knee above the level of your heart can help to minimize swelling. SEEK MEDICAL CARE IF:   You have repeated or worsening swelling.  There is drainage from the puncture area.  You develop red streaking that extends above or below the site where the needle was inserted. SEEK IMMEDIATE MEDICAL CARE IF:   You develop a fever.  You have pain that gets worse even though you are taking pain medicine.  The area is red and warm, and you have trouble moving the joint. MAKE SURE YOU:   Understand these instructions.  Will watch your condition.  Will get help right away if you are not doing well or get worse. Document Released: 03/25/2006 Document Revised: 03/26/2011 Document Reviewed: 12/20/2006 [facility] Patient Information 2015 [place], [place]. This information is not intended to replace advice given to you by your health care provider. Make sure you discuss any questions you have with your health care provider.

## 2014-05-14 NOTE — Assessment & Plan Note (Signed)
-  Greatly improved.  

## 2014-05-14 NOTE — Assessment & Plan Note (Signed)
BP Readings from Last 3 Encounters:  05/13/14 158/82  02/11/14 144/74  06/18/13 132/80    Lab Results  Component Value Date   NA 138 05/13/2014   K 3.5 05/13/2014   CREATININE 0.91 05/13/2014   He reports he is taking amlodipine 2.5mg  daily. Assessment: Blood pressure control: moderately elevated Progress toward BP goal:  deteriorated Comments:   Plan: Medications:  Increase amlodipine back to 5mg  daily Educational resources provided:   Self management tools provided:   Other plans: disccussed low salt diet.  Checked renal funtion today and will have follow up in 3 months.

## 2014-05-14 NOTE — Assessment & Plan Note (Signed)
-   Patient taking pravastatin 10mg  daily - Consider repeating lipid panel at next visit.  Will try not to order too frequently due to cost.

## 2014-05-17 NOTE — Progress Notes (Signed)
I saw and evaluated the patient.  I personally confirmed the key portions of Dr. Jodene Nam history and exam and reviewed pertinent patient test results.  The assessment, diagnosis, and plan were formulated together and I agree with the documentation in the resident's note.  I was present at the bedside for the entire procedure.

## 2014-05-27 ENCOUNTER — Encounter: Payer: Self-pay | Admitting: Internal Medicine

## 2014-07-14 ENCOUNTER — Ambulatory Visit: Payer: Self-pay

## 2014-12-16 ENCOUNTER — Ambulatory Visit (HOSPITAL_COMMUNITY)
Admission: RE | Admit: 2014-12-16 | Discharge: 2014-12-16 | Disposition: A | Discharge status: Home / Self Care | Payer: Self-pay | Source: Ambulatory Visit | Attending: Internal Medicine | Admitting: Internal Medicine

## 2014-12-16 ENCOUNTER — Ambulatory Visit (INDEPENDENT_AMBULATORY_CARE_PROVIDER_SITE_OTHER): Payer: Self-pay | Admitting: Internal Medicine

## 2014-12-16 ENCOUNTER — Telehealth: Payer: Self-pay | Admitting: Internal Medicine

## 2014-12-16 ENCOUNTER — Encounter: Payer: Self-pay | Admitting: Internal Medicine

## 2014-12-16 VITALS — BP 162/78 | HR 94 | Temp 98.3°F | Ht 70.0 in | Wt 227.1 lb

## 2014-12-16 DIAGNOSIS — M1711 Unilateral primary osteoarthritis, right knee: Secondary | ICD-10-CM

## 2014-12-16 DIAGNOSIS — M1731 Unilateral post-traumatic osteoarthritis, right knee: Secondary | ICD-10-CM

## 2014-12-16 DIAGNOSIS — I493 Ventricular premature depolarization: Secondary | ICD-10-CM | POA: Insufficient documentation

## 2014-12-16 DIAGNOSIS — N529 Male erectile dysfunction, unspecified: Secondary | ICD-10-CM

## 2014-12-16 DIAGNOSIS — G4700 Insomnia, unspecified: Secondary | ICD-10-CM

## 2014-12-16 DIAGNOSIS — Z23 Encounter for immunization: Secondary | ICD-10-CM

## 2014-12-16 DIAGNOSIS — I10 Essential (primary) hypertension: Secondary | ICD-10-CM

## 2014-12-16 MED ORDER — PRAVASTATIN SODIUM 10 MG PO TABS: 10.0000 mg | ORAL_TABLET | Freq: Every evening | ORAL | Status: DC

## 2014-12-16 MED ORDER — HYDROCHLOROTHIAZIDE 25 MG PO TABS: 25.0000 mg | ORAL_TABLET | Freq: Every day | ORAL | Status: DC

## 2014-12-16 MED ORDER — SILDENAFIL CITRATE 50 MG PO TABS: 50.0000 mg | ORAL_TABLET | ORAL | Status: DC | PRN

## 2014-12-16 MED ORDER — IBUPROFEN 800 MG PO TABS: 800.0000 mg | ORAL_TABLET | Freq: Three times a day (TID) | ORAL | Status: DC | PRN

## 2014-12-16 MED ORDER — ZOLPIDEM TARTRATE 10 MG PO TABS: 10.0000 mg | ORAL_TABLET | Freq: Every evening | ORAL | Status: DC | PRN

## 2014-12-16 MED ORDER — AMITRIPTYLINE HCL 50 MG PO TABS: 50.0000 mg | ORAL_TABLET | Freq: Every day | ORAL | Status: DC

## 2014-12-16 NOTE — Telephone Encounter (Signed)
Please call pt back regarding Ambien.  

## 2014-12-16 NOTE — Telephone Encounter (Signed)
I am a little confused the extended release ambien comes in 6.25 and 12.5mg  pills.  The regular ambien come in 10mg .  I have printed a script for the 10mg  ambien and left it in the overnight lock box.  If he needs the 12.5mg  CR (extended release) this could be discarded and a new Rx generated.

## 2014-12-16 NOTE — Progress Notes (Signed)
[facility] Subjective:   Patient ID: Scott Burgess male   DOB: 1950-09-26 64 y.o.   MRN: [id number]  HPI: Scott Burgess is a 64 y.o. male with a PMH detailed below who presents for 6 month follow up of HTN.  Please see problem based charting below for further details of the status of his chronic medical problems.    Past Medical History  Diagnosis Date  . Hyperlipidemia   . Hypertension   . Hernia   . Pneumonia   . STD (male)   . Hepatitis C, chronic     genotype 2  . Personal history of colonic adenoma 03/17/2012  . Osteoarthritis of right knee 02/11/2014   Current Outpatient Prescriptions  Medication Sig Dispense Refill  . amLODipine (NORVASC) 5 MG tablet Take 1 tablet (5 mg total) by mouth daily. 30 tablet 11  . aspirin 81 MG tablet Take 162 mg by mouth daily.     . Cholecalciferol (VITAMIN D3) 1000 UNITS CAPS Take 1 capsule by mouth daily.    Marland Kitchen ibuprofen (ADVIL,MOTRIN) 800 MG tablet Take 1 tablet (800 mg total) by mouth every 8 (eight) hours as needed. 90 tablet 3  . Multiple Vitamins-Minerals (CENTRUM SILVER ADULT 50+) TABS Take 1 tablet by mouth daily.    . pravastatin (PRAVACHOL) 10 MG tablet Take 1 tablet (10 mg total) by mouth every evening. 30 tablet 11  . sildenafil (VIAGRA) 50 MG tablet Take 1 tablet (50 mg total) by mouth as needed for erectile dysfunction. 30 tablet 3  . zolpidem (AMBIEN) 5 MG tablet Take 1 tablet (5 mg total) by mouth at bedtime as needed for sleep. 30 tablet 1   Current Facility-Administered Medications  Medication Dose Route Frequency Provider Last Rate Last Dose  . pneumococcal 23 valent vaccine (PNU-IMMUNE) injection 0.5 mL  0.5 mL Intramuscular Once Gatha Mayer, MD       Family History  Problem Relation Age of Onset  . Diabetes Mother   . Diabetes Maternal Grandmother    Social History   Social History  . Marital Status: Single    Spouse Name: N/A  . Number of Children: 3  . Years of Education: N/A    Occupational History  .    . cook    Social History Main Topics  . Smoking status: Former Smoker    Quit date: 01/15/2005  . Smokeless tobacco: Never Used  . Alcohol Use: No  . Drug Use: No  . Sexual Activity: Not on file   Other Topics Concern  . Not on file   Social History Narrative   Review of Systems: Review of Systems  Constitutional: Negative for fever.  Respiratory: Negative for shortness of breath.   Cardiovascular: Negative for chest pain.  Neurological: Negative for headaches.     Objective:  Physical Exam: Filed Vitals:   12/16/14 1327 12/16/14 1359  BP: 181/89 162/78  Pulse: 94   Temp: 98.3 F (36.8 C)   TempSrc: Oral   Height: 5\' 10"  (1.778 m)   Weight: 227 lb 1.6 oz (103.012 kg)   SpO2: 97%    Physical Exam  Constitutional: He is well-developed, well-nourished, and in no distress.  Cardiovascular: Normal rate.   Mildly irregular rhythem  Pulmonary/Chest: Effort normal and breath sounds normal.  Nursing note and vitals reviewed.    Assessment & Plan:  Case discussed with Dr. Evette Doffing  Essential hypertension He reports that his insomnia has been worse lately and he felt it could  be his amlodipine causing it. He has stopped taking amlodipine and instead has been taking "herbal tea" for his HTN.  He denies any headaches or chest pain.  A: Essential Hypertension uncontrolled  P: - I have instructed him that the herbal tea is not working, a repeat manual BP check shows some imporvement but still very elevated.  I do not see that he has ever been tried on HCTZ. - Start HCTZ 25mg  QAM -  Return in 1-2 months for BMP and BP recheck, if needed will add amlodipine back.  Osteoarthritis of right knee Reports he had about 3 months of full relief from last steroid injection.  Still pain is not terrible and he has been taking some PRN Ibuprofen.  -Continue Ibuprofen 800mg  TIDPRN - If pain severe can do another steroid injection.  Erectile  dysfunction Stable - Continue Viagra PRN  Insomnia He reports that he has implemented all of my suggestions for good sleep hygenine and still has trouble both falling and staying asleep.  This is not currently affecting his work.  Ambien has been effectivie and he has taken it PRN (less than half the nights), however he reports this is too expensive as its cost is 36 dollars.  - D/C ambien, start amitriptyline. - Re-emphisized good sleep hygeine - Patinet called back from pharmacy>> can get Ambien for $9 dollars, I have D/C Amitriptyline and restarted ambien.  Asymptomatic PVCs I detected a pulse irregulaity on exam.  - Obtained EKG and rhythm strip that shows some premature ventricular complexes but no AV block.  I discussed this with the patient and the possibility of cardiac monitoring and even potentially an ischemic workup given some of his risk factors.  He reports he is chest pain free and has no SOB and does not want an expensive workup. -  For now will monitor is asymptomatic PVCs.    Medications Ordered Meds ordered this encounter  Medications  . sildenafil (VIAGRA) 50 MG tablet    Sig: Take 1 tablet (50 mg total) by mouth as needed for erectile dysfunction.    Dispense:  30 tablet    Refill:  3  . DISCONTD: amitriptyline (ELAVIL) 50 MG tablet    Sig: Take 1 tablet (50 mg total) by mouth at bedtime.    Dispense:  30 tablet    Refill:  11  . hydrochlorothiazide (HYDRODIURIL) 25 MG tablet    Sig: Take 1 tablet (25 mg total) by mouth daily.    Dispense:  30 tablet    Refill:  11    D/c amlodipine  . ibuprofen (ADVIL,MOTRIN) 800 MG tablet    Sig: Take 1 tablet (800 mg total) by mouth every 8 (eight) hours as needed.    Dispense:  90 tablet    Refill:  3  . pravastatin (PRAVACHOL) 10 MG tablet    Sig: Take 1 tablet (10 mg total) by mouth every evening.    Dispense:  30 tablet    Refill:  11   Other Orders Orders Placed This Encounter  Procedures  . Flu Vaccine QUAD  36+ mos IM  . EKG 12-Lead   Follow Up: Return 1-2 months.

## 2014-12-16 NOTE — Patient Instructions (Signed)
General Instructions:   Please bring your medicines with you each time you come to clinic.  Medicines may include prescription medications, over-the-counter medications, herbal remedies, eye drops, vitamins, or other pills.   Progress Toward Treatment Goals:  Treatment Goal 05/13/2014  Blood pressure deteriorated    Self Care Goals & Plans:  Self Care Goal 12/16/2014  Manage my medications take my medicines as prescribed; bring my medications to every visit; refill my medications on time  Monitor my health -  Eat healthy foods eat more vegetables; eat foods that are low in salt; eat baked foods instead of fried foods  Be physically active find an activity I enjoy; take a walk every day    No flowsheet data found.   Care Management & Community Referrals:  Referral 05/13/2014  Referrals made for care management support none needed  Referrals made to community resources -     Hydrochlorothiazide, HCTZ capsules or tablets What is this medicine? HYDROCHLOROTHIAZIDE (hye droe klor oh THYE a zide) is a diuretic. It increases the amount of urine passed, which causes the body to lose salt and water. This medicine is used to treat high blood pressure. It is also reduces the swelling and water retention caused by various medical conditions, such as heart, liver, or kidney disease. This medicine may be used for other purposes; ask your health care provider or pharmacist if you have questions. What should I tell my health care provider before I take this medicine? They need to know if you have any of these conditions: -diabetes -gout -immune system problems, like lupus -kidney disease or kidney stones -liver disease -pancreatitis -small amount of urine or difficulty passing urine -an unusual or allergic reaction to hydrochlorothiazide, sulfa drugs, other medicines, foods, dyes, or preservatives -pregnant or trying to get pregnant -breast-feeding How should I use this medicine? Take  this medicine by mouth with a glass of water. Follow the directions on the prescription label. Take your medicine at regular intervals. Remember that you will need to pass urine frequently after taking this medicine. Do not take your doses at a time of day that will cause you problems. Do not stop taking your medicine unless your doctor tells you to. Talk to your pediatrician regarding the use of this medicine in children. Special care may be needed. Overdosage: If you think you have taken too much of this medicine contact a poison control center or emergency room at once. NOTE: This medicine is only for you. Do not share this medicine with others. What if I miss a dose? If you miss a dose, take it as soon as you can. If it is almost time for your next dose, take only that dose. Do not take double or extra doses. What may interact with this medicine? -cholestyramine -colestipol -digoxin -dofetilide -lithium -medicines for blood pressure -medicines for diabetes -medicines that relax muscles for surgery -other diuretics -steroid medicines like prednisone or cortisone This list may not describe all possible interactions. Give your health care provider a list of all the medicines, herbs, non-prescription drugs, or dietary supplements you use. Also tell them if you smoke, drink alcohol, or use illegal drugs. Some items may interact with your medicine. What should I watch for while using this medicine? Visit your doctor or health care professional for regular checks on your progress. Check your blood pressure as directed. Ask your doctor or health care professional what your blood pressure should be and when you should contact him or her. You may  need to be on a special diet while taking this medicine. Ask your doctor. Check with your doctor or health care professional if you get an attack of severe diarrhea, nausea and vomiting, or if you sweat a lot. The loss of too much body fluid can make it  dangerous for you to take this medicine. You may get drowsy or dizzy. Do not drive, use machinery, or do anything that needs mental alertness until you know how this medicine affects you. Do not stand or sit up quickly, especially if you are an older patient. This reduces the risk of dizzy or fainting spells. Alcohol may interfere with the effect of this medicine. Avoid alcoholic drinks. This medicine may affect your blood sugar level. If you have diabetes, check with your doctor or health care professional before changing the dose of your diabetic medicine. This medicine can make you more sensitive to the sun. Keep out of the sun. If you cannot avoid being in the sun, wear protective clothing and use sunscreen. Do not use sun lamps or tanning beds/booths. What side effects may I notice from receiving this medicine? Side effects that you should report to your doctor or health care professional as soon as possible: -allergic reactions such as skin rash or itching, hives, swelling of the lips, mouth, tongue, or throat -changes in vision -chest pain -eye pain -fast or irregular heartbeat -feeling faint or lightheaded, falls -gout attack -muscle pain or cramps -pain or difficulty when passing urine -pain, tingling, numbness in the hands or feet -redness, blistering, peeling or loosening of the skin, including inside the mouth -unusually weak or tired Side effects that usually do not require medical attention (report to your doctor or health care professional if they continue or are bothersome): -change in sex drive or performance -dry mouth -headache -stomach upset This list may not describe all possible side effects. Call your doctor for medical advice about side effects. You may report side effects to FDA at 1-800-FDA-1088. Where should I keep my medicine? Keep out of the reach of children. Store at room temperature between 15 and 30 degrees C (59 and 86 degrees F). Do not freeze. Protect  from light and moisture. Keep container closed tightly. Throw away any unused medicine after the expiration date. NOTE: This sheet is a summary. It may not cover all possible information. If you have questions about this medicine, talk to your doctor, pharmacist, or health care provider.    2016, Elsevier/[employer] Standard. (2009-08-26 12:57:37)

## 2014-12-16 NOTE — Telephone Encounter (Signed)
Pt called, he would like to go from the elavil back to [place].  States the elavil is $26 and the pharmacy told him if we order "10mg  extended release Lorrin Mais" that it would only cost him $9. Is this doable?

## 2014-12-17 DIAGNOSIS — I493 Ventricular premature depolarization: Secondary | ICD-10-CM | POA: Insufficient documentation

## 2014-12-17 NOTE — Assessment & Plan Note (Addendum)
Reports he had about 3 months of full relief from last steroid injection.  Still pain is not terrible and he has been taking some PRN Ibuprofen.  -Continue Ibuprofen 800mg  TIDPRN - If pain severe can do another steroid injection.

## 2014-12-17 NOTE — Assessment & Plan Note (Signed)
I detected a pulse irregulaity on exam.  - Obtained EKG and rhythm strip that shows some premature ventricular complexes but no AV block.  I discussed this with the patient and the possibility of cardiac monitoring and even potentially an ischemic workup given some of his risk factors.  He reports he is chest pain free and has no SOB and does not want an expensive workup. -  For now will monitor is asymptomatic PVCs.

## 2014-12-17 NOTE — Assessment & Plan Note (Signed)
He reports that his insomnia has been worse lately and he felt it could be his amlodipine causing it. He has stopped taking amlodipine and instead has been taking "herbal tea" for his HTN.  He denies any headaches or chest pain.  A: Essential Hypertension uncontrolled  P: - I have instructed him that the herbal tea is not working, a repeat manual BP check shows some imporvement but still very elevated.  I do not see that he has ever been tried on HCTZ. - Start HCTZ 25mg  QAM -  Return in 1-2 months for BMP and BP recheck, if needed will add amlodipine back.

## 2014-12-17 NOTE — Assessment & Plan Note (Signed)
Stable - Continue Viagra PRN

## 2014-12-17 NOTE — Assessment & Plan Note (Signed)
He reports that he has implemented all of my suggestions for good sleep hygenine and still has trouble both falling and staying asleep.  This is not currently affecting his work.  Ambien has been effectivie and he has taken it PRN (less than half the nights), however he reports this is too expensive as its cost is 36 dollars.  - D/C ambien, start amitriptyline. - Re-emphisized good sleep hygeine - Patinet called back from pharmacy>> can get Ambien for $9 dollars, I have D/C Amitriptyline and restarted ambien.

## 2014-12-17 NOTE — Telephone Encounter (Signed)
RX is OK as is.  Spoke with [facility] and the current RX will allow pt to receive medication for $9.  RX phoned in and hard copy destroyed per policy.

## 2014-12-21 NOTE — Addendum Note (Signed)
Addended by: Lalla Brothers T on: 12/21/2014 10:10 AM   Modules accepted: Level of Service

## 2014-12-21 NOTE — Progress Notes (Signed)
Internal Medicine Clinic Attending  Case discussed with Dr. Hoffman at the time of the visit.  We reviewed the resident's history and exam and pertinent patient test results.  I agree with the assessment, diagnosis, and plan of care documented in the resident's note.  

## 2015-01-17 MED FILL — HYDROCHLOROTHIAZIDE 25 MG T: 25 | 30 days supply | Qty: 30 | Fill #1

## 2015-01-17 MED FILL — ZOLPIDEM TARTRATE 10 MG TAB: 10 | 30 days supply | Qty: 30 | Fill #1

## 2015-02-24 ENCOUNTER — Encounter: Payer: Self-pay | Admitting: Internal Medicine

## 2015-02-24 ENCOUNTER — Telehealth: Payer: Self-pay | Admitting: *Deleted

## 2015-02-24 NOTE — Telephone Encounter (Signed)
I prescribed him 30 pills with 3 refills on 12/16/14, too early for refill at this time.  Also he was late/missed his appointment today, he was rescheduled for 03/07/15 we could address refills at that time.

## 2015-02-24 NOTE — Telephone Encounter (Signed)
Pt called and wants refills on ambien, it is not on his med list but i do see in your last note that you were going to restart it

## 2015-02-25 ENCOUNTER — Telehealth: Payer: Self-pay | Admitting: Internal Medicine

## 2015-02-25 MED FILL — HYDROCHLOROTHIAZIDE 25 MG T: 25 | 30 days supply | Qty: 30 | Fill #2

## 2015-02-25 MED FILL — IBUPROFEN 800 MG TABLET: 800 | 30 days supply | Qty: 90 | Fill #1

## 2015-02-25 MED FILL — ZOLPIDEM TARTRATE 10 MG TAB: 10 | 30 days supply | Qty: 30 | Fill #2

## 2015-02-25 NOTE — Telephone Encounter (Signed)
   Reason for call:   I received a call from Mr. Kam Mitcheltree at 8 AM indicating that he called his pharmacy and he wanted to inform his PCP that the co-pay for his Ambien will only be about $9.  He says he is able to afford this and will take Ambien or whichever medication his PCP feels is appropriate to help him sleep.   Pertinent Data:   There is a telephone note from yesterday, 02/24/15 indicating the patient called requesting Ambien refill.  Note indicates he was just prescribed Ambien with 3 refills two months ago and it is too soon for refill.  PCP intends to discuss this at next appointment which is scheduled for this month, 02/20.   Assessment / Plan / Recommendations:   I informed the patient that I would pass the message on to his PCP and PCP or RN will get back to him.  He is aware of 02/20 appt.  As always, pt is advised that if symptoms worsen or new symptoms arise, they should go to an urgent care facility or to to ER for further evaluation.   Francesca Oman, DO   02/25/2015, 11:32 AM

## 2015-03-07 ENCOUNTER — Encounter: Payer: Self-pay | Admitting: Internal Medicine

## 2015-03-07 ENCOUNTER — Ambulatory Visit (INDEPENDENT_AMBULATORY_CARE_PROVIDER_SITE_OTHER): Payer: BLUE CROSS/BLUE SHIELD | Admitting: Internal Medicine

## 2015-03-07 VITALS — BP 148/76 | HR 76 | Temp 98.1°F | Ht 70.0 in | Wt 227.7 lb

## 2015-03-07 DIAGNOSIS — B182 Chronic viral hepatitis C: Secondary | ICD-10-CM

## 2015-03-07 DIAGNOSIS — I10 Essential (primary) hypertension: Secondary | ICD-10-CM | POA: Diagnosis not present

## 2015-03-07 DIAGNOSIS — G4700 Insomnia, unspecified: Secondary | ICD-10-CM

## 2015-03-07 DIAGNOSIS — M1711 Unilateral primary osteoarthritis, right knee: Secondary | ICD-10-CM | POA: Diagnosis not present

## 2015-03-07 DIAGNOSIS — M1731 Unilateral post-traumatic osteoarthritis, right knee: Secondary | ICD-10-CM

## 2015-03-07 MED ORDER — AMLODIPINE BESYLATE 2.5 MG PO TABS: 2.5000 mg | ORAL_TABLET | Freq: Every day | ORAL | Status: DC

## 2015-03-07 MED ORDER — ZOLPIDEM TARTRATE 10 MG PO TABS: 10.0000 mg | ORAL_TABLET | Freq: Every evening | ORAL | Status: DC | PRN

## 2015-03-07 NOTE — Assessment & Plan Note (Signed)
HPI: He is taking Iburpofen 800mg  1-2 times a day as needed for knee pain.  This is making his pain tolerable.  He has not had any stomach pain with the medication.  A: OA of right knee  P: -Continue IBuprofen as needed

## 2015-03-07 NOTE — Progress Notes (Signed)
[facility] Subjective:   Patient ID: Scott Burgess male   DOB: [birth year] 65 y.o.   MRN: [id number]  HPI: [name] is a 65 y.o. male with a PMH detailed below who presents for follow up of HTN.  Please see problem based charting below for the status of his chronic medical problems.    Past Medical History  Diagnosis Date  . Hyperlipidemia   . Hypertension   . Hernia   . Pneumonia   . STD (male)   . Hepatitis C, chronic (HCC)     genotype 2  . Personal history of colonic adenoma 03/17/2012  . Osteoarthritis of right knee 02/11/2014   Current Outpatient Prescriptions  Medication Sig Dispense Refill  . amLODipine (NORVASC) 2.5 MG tablet Take 1 tablet (2.5 mg total) by mouth daily. 30 tablet 11  . aspirin 81 MG tablet Take 162 mg by mouth daily.     . Cholecalciferol (VITAMIN D3) 1000 UNITS CAPS Take 1 capsule by mouth daily.    . hydrochlorothiazide (HYDRODIURIL) 25 MG tablet Take 1 tablet (25 mg total) by mouth daily. 30 tablet 11  . ibuprofen (ADVIL,MOTRIN) 800 MG tablet Take 1 tablet (800 mg total) by mouth every 8 (eight) hours as needed. 90 tablet 3  . Multiple Vitamins-Minerals (CENTRUM SILVER ADULT 50+) TABS Take 1 tablet by mouth daily.    . pravastatin (PRAVACHOL) 10 MG tablet Take 1 tablet (10 mg total) by mouth every evening. 30 tablet 11  . sildenafil (VIAGRA) 50 MG tablet Take 1 tablet (50 mg total) by mouth as needed for erectile dysfunction. 30 tablet 3  . zolpidem (AMBIEN) 10 MG tablet Take 1 tablet (10 mg total) by mouth at bedtime as needed for sleep. 30 tablet 2   Current Facility-Administered Medications  Medication Dose Route Frequency Provider Last Rate Last Dose  . pneumococcal 23 valent vaccine (PNU-IMMUNE) injection 0.5 mL  0.5 mL Intramuscular Once Gatha Mayer, MD       Family History  Problem Relation Age of Onset  . Diabetes Mother   . Diabetes Maternal Grandmother    Social History   Social History  . Marital  Status: Single    Spouse Name: N/A  . Number of Children: 3  . Years of Education: N/A   Occupational History  .    . cook    Social History Main Topics  . Smoking status: Former Smoker    Quit date: 01/15/2005  . Smokeless tobacco: Never Used  . Alcohol Use: No  . Drug Use: No  . Sexual Activity: Not Asked   Other Topics Concern  . None   Social History Narrative   Review of Systems: Review of Systems  Respiratory: Negative for cough.   Cardiovascular: Negative for chest pain.  Gastrointestinal: Negative for abdominal pain, diarrhea, blood in stool and melena.  Musculoskeletal: Positive for joint pain.  Neurological: Negative for headaches.     Objective:  Physical Exam: Filed Vitals:   03/07/15 0832  BP: 148/76  Pulse: 76  Temp: 98.1 F (36.7 C)  TempSrc: Oral  Height: 5\' 10"  (1.778 m)  Weight: 227 lb 11.2 oz (103.284 kg)  SpO2: 100%  Physical Exam  Constitutional: He is well-developed, well-nourished, and in no distress.  Nursing note and vitals reviewed.   Assessment & Plan:  Case discussed with Dr. Daryll Drown  Essential hypertension HPI: He has been taking the HCTZ, does urinate more frequently but overall is tolerating well.  A: Essential  HTN, improving but not at goal  P: - Continue HCTZ 25mg  dialy - Add back 2.5mg  of amlodipine - Check BMP today -Follow up in 3 months.  Hepatitis C, chronic - genotype 2 HPI:  He has become more serious about his health.  He has previously been told his liver damage was not severe enough to warrant treatment but wants to know if that is still the case.  A; Chronic Hepatitis C  P: Recheck Viral Load U/S with elastography  Osteoarthritis of right knee HPI: He is taking Iburpofen 800mg  1-2 times a day as needed for knee pain.  This is making his pain tolerable.  He has not had any stomach pain with the medication.  A: OA of right knee  P: -Continue IBuprofen as needed  Insomnia HPI : He is taking 10mg  of  Ambien as needed for sleep. With the Ambien he gets 6hours of sleep.  I was confused about an early refill request however he has his medication with him today with 1/2 a pill bottle and a refill left.  He reports he is taking the medication about 15 nights a month.  A: Insomnia  P: Continue Ambien 10mg  QHSPRN    Medications Ordered Meds ordered this encounter  Medications  . amLODipine (NORVASC) 2.5 MG tablet    Sig: Take 1 tablet (2.5 mg total) by mouth daily.    Dispense:  30 tablet    Refill:  11  . zolpidem (AMBIEN) 10 MG tablet    Sig: Take 1 tablet (10 mg total) by mouth at bedtime as needed for sleep.    Dispense:  30 tablet    Refill:  2   Other Orders Orders Placed This Encounter  Procedures  . US ABDOMEN COMPLETE W/ELASTOGRAPHY    Standing Status: Future     Number of Occurrences:      Standing Expiration Date: 05/04/2016    Order Specific Question:  Reason for Exam (SYMPTOM  OR DIAGNOSIS REQUIRED)    Answer:  Chronic hepatitis C    Order Specific Question:  Preferred imaging location?    Answer:  [place]  . Hepatitis C RNA quantitative   Follow Up: Return in about 3 months (around 06/04/2015).

## 2015-03-07 NOTE — Assessment & Plan Note (Signed)
HPI: He has been taking the HCTZ, does urinate more frequently but overall is tolerating well.  A: Essential HTN, improving but not at goal  P: - Continue HCTZ 25mg  dialy - Add back 2.5mg  of amlodipine - Check BMP today -Follow up in 3 months.

## 2015-03-07 NOTE — Assessment & Plan Note (Signed)
HPI:  He has become more serious about his health.  He has previously been told his liver damage was not severe enough to warrant treatment but wants to know if that is still the case.  A; Chronic Hepatitis C  P: Recheck Viral Load U/S with elastography

## 2015-03-07 NOTE — Patient Instructions (Addendum)
General Instructions:  I want you to start back on Amlodipine 2.5mg  daily (1/2 a pill till you get your new prescription)  Please bring your medicines with you each time you come to clinic.  Medicines may include prescription medications, over-the-counter medications, herbal remedies, eye drops, vitamins, or other pills.   Progress Toward Treatment Goals:  Treatment Goal 03/07/2015  Blood pressure improved    Self Care Goals & Plans:  Self Care Goal 03/07/2015  Manage my medications take my medicines as prescribed; bring my medications to every visit; refill my medications on time  Eat healthy foods eat more vegetables; eat foods that are low in salt; eat baked foods instead of fried foods  Be physically active find an activity I enjoy; take a walk every day    No flowsheet data found.   Care Management & Community Referrals:  Referral 03/07/2015  Referrals made for care management support none needed

## 2015-03-07 NOTE — Assessment & Plan Note (Signed)
HPI : He is taking 10mg  of Ambien as needed for sleep. With the Ambien he gets 6hours of sleep.  I was confused about an early refill request however he has his medication with him today with 1/2 a pill bottle and a refill left.  He reports he is taking the medication about 15 nights a month.  A: Insomnia  P: Continue Ambien 10mg  QHSPRN

## 2015-03-07 NOTE — Addendum Note (Signed)
Addended by: Truddie Crumble on: 03/07/2015 09:48 AM   Modules accepted: Orders

## 2015-03-08 LAB — BMP8+ANION GAP
ANION GAP: 18 mmol/L (ref 10.0–18.0)
BUN / CREAT RATIO: 17 (ref 10–22)
BUN: 15 mg/dL (ref 8–27)
CALCIUM: 9.3 mg/dL (ref 8.6–10.2)
CHLORIDE: 99 mmol/L (ref 96–106)
CO2: 23 mmol/L (ref 18–29)
Creatinine, Ser: 0.9 mg/dL (ref 0.76–1.27)
GFR calc Af Amer: 105 mL/min/{1.73_m2} (ref >59)
GFR, EST NON AFRICAN AMERICAN: 91 mL/min/{1.73_m2} (ref >59)
Glucose: 101 mg/dL — ABNORMAL HIGH (ref 65–99)
Potassium: 3.6 mmol/L (ref 3.5–5.2)
SODIUM: 140 mmol/L (ref 134–144)

## 2015-03-08 NOTE — Progress Notes (Signed)
[facility] Medicine Clinic Attending  Case discussed with Dr. Hoffman soon after the resident saw the patient.  We reviewed the resident's history and exam and pertinent patient test results.  I agree with the assessment, diagnosis, and plan of care documented in the resident's note. 

## 2015-03-11 LAB — HCV RNA QUANT
HCV log10: 5.782 log10 IU/mL
Hepatitis C Quantitation: 605000 IU/mL

## 2015-03-29 ENCOUNTER — Ambulatory Visit (HOSPITAL_COMMUNITY)
Admission: RE | Admit: 2015-03-29 | Discharge: 2015-03-29 | Disposition: A | Discharge status: Home / Self Care | Payer: BLUE CROSS/BLUE SHIELD | Source: Ambulatory Visit | Attending: Internal Medicine | Admitting: Internal Medicine

## 2015-03-29 DIAGNOSIS — R93421 Abnormal radiologic findings on diagnostic imaging of right kidney: Secondary | ICD-10-CM | POA: Insufficient documentation

## 2015-03-29 DIAGNOSIS — B182 Chronic viral hepatitis C: Secondary | ICD-10-CM | POA: Diagnosis not present

## 2015-03-30 MED FILL — HYDROCHLOROTHIAZIDE 25 MG T: 25 | 30 days supply | Qty: 30 | Fill #3

## 2015-03-30 MED FILL — IBUPROFEN 800 MG TABLET: 800 | 30 days supply | Qty: 90 | Fill #2

## 2015-04-12 MED FILL — ZOLPIDEM TARTRATE 10 MG TAB: 10 | 30 days supply | Qty: 30 | Fill #3

## 2015-05-03 MED FILL — HYDROCHLOROTHIAZIDE 25 MG T: 25 | 90 days supply | Qty: 90 | Fill #4

## 2015-05-04 ENCOUNTER — Other Ambulatory Visit: Payer: Self-pay | Admitting: Internal Medicine

## 2015-05-04 NOTE — Telephone Encounter (Signed)
Last appt 03/07/15.

## 2015-05-04 NOTE — Telephone Encounter (Signed)
It appears I gave him 30 pills with 2 refills on 2/20.  This should last 6 months if he is taking no more than 15 days a month.  Either way refill on 4/19 is WAY to early.

## 2015-05-09 ENCOUNTER — Other Ambulatory Visit: Payer: Self-pay | Admitting: Internal Medicine

## 2015-05-10 MED FILL — ZOLPIDEM TARTRATE 10 MG TAB: 10 | 30 days supply | Qty: 30 | Fill #0

## 2015-05-10 NOTE — Telephone Encounter (Signed)
4/24 still too early see telephone note from 1st denial on 4/19

## 2015-05-10 NOTE — Telephone Encounter (Signed)
Pt's zolpidem from visit in feb had not been called in at the visit, called pharm and gave it to them verbally

## 2015-05-10 NOTE — Telephone Encounter (Signed)
Oh ok, thank you Bonnita Nasuti

## 2015-06-17 MED FILL — ZOLPIDEM TARTRATE 10 MG TAB: 10 | 30 days supply | Qty: 30 | Fill #1

## 2015-07-04 ENCOUNTER — Ambulatory Visit: Payer: BLUE CROSS/BLUE SHIELD | Admitting: Internal Medicine

## 2015-07-04 ENCOUNTER — Encounter: Payer: Self-pay | Admitting: Internal Medicine

## 2015-07-18 MED FILL — IBUPROFEN 800 MG TABLET: 800 | 30 days supply | Qty: 90 | Fill #3

## 2015-07-18 MED FILL — ZOLPIDEM TARTRATE 10 MG TAB: 10 | 30 days supply | Qty: 30 | Fill #2

## 2015-08-03 ENCOUNTER — Other Ambulatory Visit: Payer: Self-pay

## 2015-08-03 NOTE — Telephone Encounter (Signed)
Requesting Viagra to be filled @ [facility].

## 2015-08-04 ENCOUNTER — Ambulatory Visit (INDEPENDENT_AMBULATORY_CARE_PROVIDER_SITE_OTHER): Payer: BLUE CROSS/BLUE SHIELD | Admitting: Internal Medicine

## 2015-08-04 ENCOUNTER — Encounter: Payer: Self-pay | Admitting: Internal Medicine

## 2015-08-04 VITALS — BP 155/88 | HR 75 | Temp 98.3°F | Ht 70.0 in | Wt 221.8 lb

## 2015-08-04 DIAGNOSIS — N529 Male erectile dysfunction, unspecified: Secondary | ICD-10-CM

## 2015-08-04 DIAGNOSIS — M1711 Unilateral primary osteoarthritis, right knee: Secondary | ICD-10-CM

## 2015-08-04 DIAGNOSIS — I493 Ventricular premature depolarization: Secondary | ICD-10-CM | POA: Diagnosis not present

## 2015-08-04 DIAGNOSIS — Z87891 Personal history of nicotine dependence: Secondary | ICD-10-CM

## 2015-08-04 DIAGNOSIS — I10 Essential (primary) hypertension: Secondary | ICD-10-CM | POA: Diagnosis not present

## 2015-08-04 DIAGNOSIS — G4700 Insomnia, unspecified: Secondary | ICD-10-CM

## 2015-08-04 DIAGNOSIS — M1731 Unilateral post-traumatic osteoarthritis, right knee: Secondary | ICD-10-CM

## 2015-08-04 DIAGNOSIS — B182 Chronic viral hepatitis C: Secondary | ICD-10-CM

## 2015-08-04 DIAGNOSIS — Z79899 Other long term (current) drug therapy: Secondary | ICD-10-CM

## 2015-08-04 DIAGNOSIS — H269 Unspecified cataract: Secondary | ICD-10-CM

## 2015-08-04 DIAGNOSIS — E785 Hyperlipidemia, unspecified: Secondary | ICD-10-CM

## 2015-08-04 MED ORDER — ZOLPIDEM TARTRATE 10 MG PO TABS: 10.0000 mg | ORAL_TABLET | Freq: Every evening | ORAL | Status: DC | PRN

## 2015-08-04 MED ORDER — SILDENAFIL CITRATE 100 MG PO TABS: 100.0000 mg | ORAL_TABLET | ORAL | Status: DC | PRN

## 2015-08-04 NOTE — Telephone Encounter (Signed)
Dr Heber Damascus did this today

## 2015-08-04 NOTE — Progress Notes (Signed)
  Subjective:   HPI: [name] is a 65 y.o. male who presents today for follow up of his hypertension.  Please see problem based charting for the status of his chronic medical problems.    Review of Systems: Per HPI  Objective:  Physical Exam: Filed Vitals:   08/04/15 0844  BP: 155/88  Pulse: 75  Temp: 98.3 F (36.8 C)  TempSrc: Oral  Height: 5\' 10"  (1.778 m)  Weight: 221 lb 12.8 oz (100.608 kg)  SpO2: 96%   Physical Exam  Constitutional: He is well-developed, well-nourished, and in no distress.  Neck: Neck supple.  Cardiovascular: Normal rate, regular rhythm and normal heart sounds.   Pulmonary/Chest: Effort normal and breath sounds normal. No respiratory distress. He has no wheezes. He has no rales.  Abdominal: Soft. Bowel sounds are normal. There is no tenderness.  Musculoskeletal: He exhibits no edema.  Nursing note and vitals reviewed.   Assessment & Plan:  See encounters tab for Problem Based A&P charting

## 2015-08-04 NOTE — Assessment & Plan Note (Signed)
HPI: When taking Ambien he can get at least 6 hours of uninterrupted sleep. Denies any excessive daytime sleepiness.    A: Insomnia  P; Continue Ambein 10mg  QHS PRN, cautioned to try to use <15 nights per month.

## 2015-08-04 NOTE — Assessment & Plan Note (Signed)
Reviewed note from [place] [facility] 06/11/13 which noted bilateral cataracts and added this to problem list.

## 2015-08-04 NOTE — Assessment & Plan Note (Signed)
HPI: He has not been taking pravasatin, is concerned that it can worsen his insomnia, he is also concerned that it may make him light headed.  A: Dyslipidemia  P:  I discussed the benefits of pravastatin, he likely still has Vit D deficient and it could be this is worsening some myalgias although he does not really describe myalgias.  I would like him to restart on Vit D and pravastatin, OK to take Pravastatin in the AM.  We may consider a change to a low dose of Crestor in the future.

## 2015-08-04 NOTE — Assessment & Plan Note (Signed)
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  A: OA or right knee  P; Continue Ibuprofen

## 2015-08-04 NOTE — Patient Instructions (Addendum)
General Instructions:   Thank you for bringing your medicines today. This helps [place] keep you safe from mistakes.   Progress Toward Treatment Goals:  Treatment Goal 03/07/2015  Blood pressure improved    Self Care Goals & Plans:  Self Care Goal 08/04/2015  Manage my medications take my medicines as prescribed; bring my medications to every visit; refill my medications on time  Eat healthy foods eat more vegetables; eat foods that are low in salt; eat baked foods instead of fried foods; eat fruit for snacks and desserts  Be physically active take a walk every day    No flowsheet data found.   Care Management & Community Referrals:  Referral 03/07/2015  Referrals made for care management support none needed       Vitamin D Deficiency Vitamin D deficiency is when your body does not have enough vitamin D. Vitamin D is important to your body for many reasons:  It helps the body to absorb two important minerals, called calcium and phosphorus.  It plays a role in bone health.  It may help to prevent some diseases, such as diabetes and multiple sclerosis.  It plays a role in muscle function, including heart function. You can get vitamin D by:  Eating foods that naturally contain vitamin D.  Eating or drinking milk or other dairy products that have vitamin D added to them.  Taking a vitamin D supplement or a multivitamin supplement that contains vitamin D.  Being in the sun. Your body naturally makes vitamin D when your skin is exposed to sunlight. Your body changes the sunlight into a form of the vitamin that the body can use. If vitamin D deficiency is severe, it can cause a condition in which your bones become soft. In adults, this condition is called osteomalacia. In children, this condition is called rickets. CAUSES Vitamin D deficiency may be caused by:  Not eating enough foods that contain vitamin D.  Not getting enough sun exposure.  Having certain digestive system  diseases that make it difficult for your body to absorb vitamin D. These diseases include Crohn disease, chronic pancreatitis, and cystic fibrosis.  Having a surgery in which a part of the stomach or a part of the small intestine is removed.  Being obese.  Having chronic kidney disease or liver disease. RISK FACTORS This condition is more likely to develop in:  Older people.  People who do not spend much time outdoors.  People who live in a long-term care facility.  People who have had broken bones.  People with weak or thin bones (osteoporosis).  People who have a disease or condition that changes how the body absorbs vitamin D.  People who have dark skin.  People who take certain medicines, such as steroid medicines or certain seizure medicines.  People who are overweight or obese. SYMPTOMS In mild cases of vitamin D deficiency, there may not be any symptoms. If the condition is severe, symptoms may include:  Bone pain.  Muscle pain.  Falling often.  Broken bones caused by a minor injury. DIAGNOSIS This condition is usually diagnosed with a blood test.  TREATMENT Treatment for this condition may depend on what caused the condition. Treatment options include:  Taking vitamin D supplements.  Taking a calcium supplement. Your health care provider will suggest what dose is best for you. HOME CARE INSTRUCTIONS  Take medicines and supplements only as told by your health care provider.  Eat foods that contain vitamin D. Choices include:  Fortified dairy products, cereals, or juices. Fortified means that vitamin D has been added to the food. Check the label on the package to be sure.  Fatty fish, such as salmon or trout.  Eggs.  Oysters.  Do not use a tanning bed.  Maintain a healthy weight. Lose weight, if needed.  Keep all follow-up visits as told by your health care provider. This is important. SEEK MEDICAL CARE IF:  Your symptoms do not go away.  You  feel like throwing up (nausea) or you throw up (vomit).  You have fewer bowel movements than usual or it is difficult for you to have a bowel movement (constipation).   This information is not intended to replace advice given to you by your health care provider. Make sure you discuss any questions you have with your health care provider.   Document Released: 03/26/2011 Document Revised: 09/22/2014 Document Reviewed: 05/19/2014 Elsevier Interactive Patient Education [employer].

## 2015-08-04 NOTE — Assessment & Plan Note (Signed)
HPI: No palpitations, no syncope or near syncope.  A: Asymptomatic PVC  P: Continue to monitor.

## 2015-08-04 NOTE — Assessment & Plan Note (Signed)
HPI: Has been taking HCTZ not taking amlodipine was concerned that it may make him urinate more.  Overall he feels well and denies any symptoms. He denies any headaches or leg swelling.  A: Essential HTN not at goal  P: Continue HCTZ 25mg  daily Resume Amlodipine 2.5mg  daily

## 2015-08-04 NOTE — Assessment & Plan Note (Signed)
I discussed again the results of his U/S, he wants to hold off treatment until he can afford the cost. His current insurance [employer] has a very high deductible.

## 2015-08-04 NOTE — Assessment & Plan Note (Signed)
HPI: Receives 10 pills of viagra  from [facility], medication usually works.  A: ED  P: Will increase to Viagra 100mg  and patient may take half a pill or full pill as needed for sexual activity

## 2015-08-04 NOTE — Progress Notes (Deleted)
i

## 2015-08-10 MED FILL — AMLODIPINE BESYLATE 2.5 MG: 2.5 | 90 days supply | Qty: 90 | Fill #0

## 2015-08-15 MED FILL — ZOLPIDEM TARTRATE 10 MG TAB: 10 | 30 days supply | Qty: 30 | Fill #0

## 2015-09-15 MED FILL — ZOLPIDEM TARTRATE 10 MG TAB: 10 | 30 days supply | Qty: 30 | Fill #1

## 2015-09-29 ENCOUNTER — Telehealth: Payer: Self-pay | Admitting: Internal Medicine

## 2015-09-29 MED ORDER — SILDENAFIL CITRATE 20 MG PO TABS: ORAL_TABLET | ORAL | 5 refills | Status: DC

## 2015-09-29 NOTE — Telephone Encounter (Signed)
Pt wanted to notify you that his sildenafil (VIAGRA) [id number] MG tablet re certification has been denied through his [employer] and would like to discuss this with you at your leisure or next appointment.  Please advise

## 2015-09-29 NOTE — Telephone Encounter (Signed)
Spoke with patient, he has lost coverage with [facility] and insurance does not cover.  I will provide him a prescription for the generic sildenafil 20mg  pills to take 2-5 pills as needed.  He thinks he will be able to afford this.

## 2015-10-14 ENCOUNTER — Other Ambulatory Visit: Payer: Self-pay | Admitting: *Deleted

## 2015-10-14 MED FILL — HYDROCHLOROTHIAZIDE 25 MG T: 25 | 90 days supply | Qty: 90 | Fill #5

## 2015-10-17 MED ORDER — IBUPROFEN 800 MG PO TABS: 800.0000 mg | ORAL_TABLET | Freq: Three times a day (TID) | ORAL | 0 refills | Status: DC | PRN

## 2015-10-17 MED FILL — IBUPROFEN 800 MG TABLET: 800 | 30 days supply | Qty: 90 | Fill #0

## 2015-10-20 MED FILL — ZOLPIDEM TARTRATE 10 MG TAB: 10 | 30 days supply | Qty: 30 | Fill #2

## 2015-11-21 MED FILL — AMLODIPINE BESYLATE 2.5 MG: 2.5 | 90 days supply | Qty: 90 | Fill #1

## 2015-11-22 ENCOUNTER — Other Ambulatory Visit: Payer: Self-pay | Admitting: *Deleted

## 2015-11-22 MED ORDER — ZOLPIDEM TARTRATE 10 MG PO TABS: 10.0000 mg | ORAL_TABLET | Freq: Every evening | ORAL | 3 refills | Status: DC | PRN

## 2015-11-22 MED FILL — ZOLPIDEM TARTRATE 10 MG TAB: 10 | 30 days supply | Qty: 30 | Fill #0

## 2015-11-22 NOTE — Telephone Encounter (Signed)
Holley Raring, could you phone this in?

## 2015-11-22 NOTE — Telephone Encounter (Signed)
Zolpidem rx called to [place].

## 2015-11-25 ENCOUNTER — Telehealth: Payer: Self-pay | Admitting: Internal Medicine

## 2015-11-25 NOTE — Telephone Encounter (Signed)
APT. REMINDER CALL, LMTCB °

## 2015-11-28 ENCOUNTER — Ambulatory Visit (INDEPENDENT_AMBULATORY_CARE_PROVIDER_SITE_OTHER): Payer: BLUE CROSS/BLUE SHIELD | Admitting: *Deleted

## 2015-11-28 ENCOUNTER — Ambulatory Visit: Payer: BLUE CROSS/BLUE SHIELD

## 2015-11-28 DIAGNOSIS — Z23 Encounter for immunization: Secondary | ICD-10-CM

## 2015-12-21 MED FILL — ZOLPIDEM TARTRATE 10 MG TAB: 10 | 30 days supply | Qty: 30 | Fill #1

## 2016-01-25 MED FILL — ZOLPIDEM TARTRATE 10 MG TAB: 10 | 30 days supply | Qty: 30 | Fill #2

## 2016-02-24 MED FILL — ZOLPIDEM TARTRATE 10 MG TAB: 10 | 30 days supply | Qty: 30 | Fill #3

## 2016-02-24 MED FILL — AMLODIPINE BESYLATE 2.5 MG: 2.5 | 90 days supply | Qty: 90 | Fill #2

## 2016-03-29 ENCOUNTER — Other Ambulatory Visit: Payer: Self-pay | Admitting: *Deleted

## 2016-03-30 MED ORDER — ZOLPIDEM TARTRATE 10 MG PO TABS: 10.0000 mg | ORAL_TABLET | Freq: Every evening | ORAL | 2 refills | Status: DC | PRN

## 2016-03-30 MED FILL — ZOLPIDEM TARTRATE 10 MG TAB: 10 | 30 days supply | Qty: 30 | Fill #0

## 2016-03-30 NOTE — Telephone Encounter (Signed)
I would like to see Mr Gabler in about 2-3 months.  Glenda could you also call this prescription in?

## 2016-03-30 NOTE — Telephone Encounter (Signed)
Zolpidem rx called to [facility]. Also message sent to front office to schedule an appt.

## 2016-04-30 MED FILL — ZOLPIDEM TARTRATE 10 MG TAB: 10 | 30 days supply | Qty: 30 | Fill #1

## 2016-05-23 NOTE — Progress Notes (Signed)
[facility] Subjective:  HPI: Scott Burgess is a 66 y.o. male who presents for follow up of HTN.   Please see problem based charting below for the status of his chronic medical problems.     Review of Systems: Review of Systems  Constitutional: Negative for malaise/fatigue.  Respiratory: Negative for cough.   Cardiovascular: Negative for chest pain.  Neurological: Negative for dizziness and headaches.    Objective:  Physical Exam: Vitals:   05/24/16 0817  BP: (!) 150/79  Pulse: 66  Temp: 97.7 F (36.5 C)  TempSrc: Oral  SpO2: 99%  Height: 5\' 10"  (1.778 m)   Physical Exam  Constitutional: He is well-developed, well-nourished, and in no distress. No distress.  HENT:  Head: Normocephalic and atraumatic.  Eyes: Conjunctivae are normal.  Cardiovascular: Normal rate, regular rhythm, normal heart sounds and intact distal pulses.   No murmur heard. Pulmonary/Chest: Effort normal and breath sounds normal. No respiratory distress. He has no wheezes. He has no rales.  Abdominal: Soft. Bowel sounds are normal. He exhibits no distension. There is no tenderness.  Musculoskeletal: He exhibits no edema.  Skin: Skin is warm and dry. He is not diaphoretic.  Psychiatric: Affect and judgment normal.  Nursing note and vitals reviewed.  Assessment & Plan:  Essential hypertension HPI: He has only been taking Amlodipine 2.5mg  daily. He has not been taking HCTZ.  He has no complaints.  A: Essential HTN, not at goal  P: Will increase Amlodipine to 5mg  daily.  He is resistant to adding another antihypertensive at this time. Follow up in 2 months.  If not at goal will consider addition of thiazide or increase to max dose amlodipine.  Osteoarthritis of right knee HPI: Right knee continues to bother him more than left.  Takes 800mg  of Ibuprofen occasionally.  Ususally just once a day if needed.  Has used some OTC braces which have helped some.  A: OA of right  knee  P: Continue ibuprofen, discussed possibility of PT which he will consider.  Erectile dysfunction HPI: He has not been able to receive Viagra lately as  [facility] will not provide due to his [employer].  A: ED  P: He is looking into new insurances at this time, will need to readdress at next visit with his insurance.  Insomnia HPI: Insomnia has been well controlled with ambien.  Has been taking 5-6 nights a week on average. No sleep walking, no daytime fatigue.  A: Insomnia  P: Continue ambien 10mg  QHS   Medications Ordered Meds ordered this encounter  Medications  . amLODipine (NORVASC) 5 MG tablet    Sig: Take 1 tablet (5 mg total) by mouth daily.    Dispense:  90 tablet    Refill:  3  . ibuprofen (ADVIL,MOTRIN) 800 MG tablet    Sig: Take 1 tablet (800 mg total) by mouth every 8 (eight) hours as needed.    Dispense:  90 tablet    Refill:  1  . zolpidem (AMBIEN) 10 MG tablet    Sig: Take 1 tablet (10 mg total) by mouth at bedtime as needed for sleep.    Dispense:  30 tablet    Refill:  3   Other Orders Orders Placed This Encounter  Procedures  . BMP8+Anion Gap   Follow Up: Return 2-3 months.

## 2016-05-24 ENCOUNTER — Encounter: Payer: Self-pay | Admitting: Internal Medicine

## 2016-05-24 ENCOUNTER — Ambulatory Visit (INDEPENDENT_AMBULATORY_CARE_PROVIDER_SITE_OTHER): Payer: Medicare Other | Admitting: Internal Medicine

## 2016-05-24 VITALS — BP 150/79 | HR 66 | Temp 97.7°F | Ht 70.0 in

## 2016-05-24 DIAGNOSIS — I10 Essential (primary) hypertension: Secondary | ICD-10-CM | POA: Diagnosis not present

## 2016-05-24 DIAGNOSIS — Z87891 Personal history of nicotine dependence: Secondary | ICD-10-CM

## 2016-05-24 DIAGNOSIS — G4700 Insomnia, unspecified: Secondary | ICD-10-CM | POA: Diagnosis not present

## 2016-05-24 DIAGNOSIS — Z79899 Other long term (current) drug therapy: Secondary | ICD-10-CM

## 2016-05-24 DIAGNOSIS — N529 Male erectile dysfunction, unspecified: Secondary | ICD-10-CM | POA: Diagnosis not present

## 2016-05-24 DIAGNOSIS — M1731 Unilateral post-traumatic osteoarthritis, right knee: Secondary | ICD-10-CM | POA: Diagnosis not present

## 2016-05-24 MED ORDER — AMLODIPINE BESYLATE 5 MG PO TABS: 5.0000 mg | ORAL_TABLET | Freq: Every day | ORAL | 3 refills | Status: DC

## 2016-05-24 MED ORDER — ZOLPIDEM TARTRATE 10 MG PO TABS: 10.0000 mg | ORAL_TABLET | Freq: Every evening | ORAL | 3 refills | Status: DC | PRN

## 2016-05-24 MED ORDER — IBUPROFEN 800 MG PO TABS: 800.0000 mg | ORAL_TABLET | Freq: Three times a day (TID) | ORAL | 1 refills | Status: DC | PRN

## 2016-05-24 MED FILL — IBUPROFEN 800 MG TAB: 800 | 30 days supply | Qty: 90 | Fill #0

## 2016-05-24 MED FILL — AMLODIPINE BESYLATE 5 MG TA: 5 | 90 days supply | Qty: 90 | Fill #0

## 2016-05-24 NOTE — Patient Instructions (Signed)
I want you to increase you amlodipine to 5 mg a day.

## 2016-05-25 NOTE — Assessment & Plan Note (Signed)
HPI: Right knee continues to bother him more than left.  Takes 800mg  of Ibuprofen occasionally.  Ususally just once a day if needed.  Has used some OTC braces which have helped some.  A: OA of right knee  P: Continue ibuprofen, discussed possibility of PT which he will consider.

## 2016-05-25 NOTE — Assessment & Plan Note (Signed)
HPI: Insomnia has been well controlled with ambien.  Has been taking 5-6 nights a week on average. No sleep walking, no daytime fatigue.  A: Insomnia  P: Continue ambien 10mg  QHS

## 2016-05-25 NOTE — Assessment & Plan Note (Signed)
HPI: He has not been able to receive Viagra lately as [facility] will not provide due to his [employer].  A: ED  P: He is looking into new insurances at this time, will need to readdress at next visit with his insurance.

## 2016-05-25 NOTE — Assessment & Plan Note (Signed)
HPI: He has only been taking Amlodipine 2.5mg  daily. He has not been taking HCTZ.  He has no complaints.  A: Essential HTN, not at goal  P: Will increase Amlodipine to 5mg  daily.  He is resistant to adding another antihypertensive at this time. Follow up in 2 months.  If not at goal will consider addition of thiazide or increase to max dose amlodipine.

## 2016-05-28 MED FILL — ZOLPIDEM TARTRATE 10 MG TAB: 10 | 30 days supply | Qty: 30 | Fill #2

## 2016-06-29 MED FILL — ZOLPIDEM TARTRATE 10 MG TAB: 10 | 30 days supply | Qty: 30 | Fill #0

## 2016-07-31 MED FILL — ZOLPIDEM TARTRATE 10 MG TAB: 10 | 30 days supply | Qty: 30 | Fill #1

## 2016-08-27 MED FILL — AMLODIPINE BESYLATE 5 MG TA: 5 | 90 days supply | Qty: 90 | Fill #1

## 2016-08-28 NOTE — Progress Notes (Signed)
[facility] Subjective:  HPI: [name] is a 66 y.o. male who presents for 3 month follow up of HTN  Please see Assessment and Plan below for the status of his chronic medical problems.  Review of Systems: No fever, chills, chest pain, SOB, claudication. Occasionally has cramping of hands at end of day. Objective:  Physical Exam: Vitals:   08/30/16 1026  BP: 139/86  Pulse: 73  Temp: 98.5 F (36.9 C)  TempSrc: Oral  SpO2: 98%  Weight: 224 lb (101.6 kg)  Height: 5\' 10"  (1.778 m)  Physical Exam  Constitutional: He is well-developed, well-nourished, and in no distress.  HENT:  Head: Normocephalic and atraumatic.  Mouth/Throat: Oropharynx is clear and moist.  Eyes: Conjunctivae are normal.  Cardiovascular: Normal rate and regular rhythm.   Pulmonary/Chest: Effort normal and breath sounds normal.  Musculoskeletal: He exhibits no edema.  Skin: Skin is warm and dry.  Psychiatric: Mood and affect normal.  Nursing note and vitals reviewed.   Assessment & Plan:  Essential hypertension HPI: taking 5mg  of amlodipine daily, no complaints. Cost is $9 a month> affordable  A: Essential HTN controlled  P: Continue Amlodipine 5mg  daily, discussed low salt diet Repeat BMP  Vitamin D deficiency HPI: Used to take Vit D supplements, no longer taking but is taking Centrum silver tablet daily.  A: Vitamin D deficiency  P: Repeat Vit D level  Insomnia HPI: Doing well with Ambien, no current issues  A: Insomnia  P: Continue ambien.  Osteoarthritis of right knee HPI: Ibuprofen helping. Takes PRN up to tid  A: OA of right knee- controlled  P: Continue PRN ibuprofen He did receive a lot of benefit from previous steroid injection can also repeat as needed  Erectile dysfunction HPI: Has found more affordable sildenafil.  This is helping  A: ED  P: Continue sildenafil   Medications Ordered Meds ordered this encounter  Medications  .  zolpidem (AMBIEN) 10 MG tablet    Sig: TAKE 1 TABLET BY MOUTH ONCE DAILY AT BEDTIME AS NEEDED FOR SLEEP    Refill:  3   Other Orders Orders Placed This Encounter  Procedures  . BMP8+Anion Gap  . Vitamin D (25 hydroxy)   Follow Up: Return in about 6 months (around 03/02/2017).

## 2016-08-29 MED FILL — ZOLPIDEM TARTRATE 10 MG TAB: 10 | 30 days supply | Qty: 30 | Fill #2

## 2016-08-30 ENCOUNTER — Ambulatory Visit (INDEPENDENT_AMBULATORY_CARE_PROVIDER_SITE_OTHER): Payer: Medicare Other | Admitting: Internal Medicine

## 2016-08-30 ENCOUNTER — Encounter: Payer: Self-pay | Admitting: Internal Medicine

## 2016-08-30 VITALS — BP 139/86 | HR 73 | Temp 98.5°F | Ht 70.0 in | Wt 224.0 lb

## 2016-08-30 DIAGNOSIS — G4700 Insomnia, unspecified: Secondary | ICD-10-CM

## 2016-08-30 DIAGNOSIS — Z87891 Personal history of nicotine dependence: Secondary | ICD-10-CM | POA: Diagnosis not present

## 2016-08-30 DIAGNOSIS — I10 Essential (primary) hypertension: Secondary | ICD-10-CM | POA: Diagnosis not present

## 2016-08-30 DIAGNOSIS — N529 Male erectile dysfunction, unspecified: Secondary | ICD-10-CM

## 2016-08-30 DIAGNOSIS — B182 Chronic viral hepatitis C: Secondary | ICD-10-CM

## 2016-08-30 DIAGNOSIS — E559 Vitamin D deficiency, unspecified: Secondary | ICD-10-CM

## 2016-08-30 DIAGNOSIS — Z79899 Other long term (current) drug therapy: Secondary | ICD-10-CM

## 2016-08-30 DIAGNOSIS — M1711 Unilateral primary osteoarthritis, right knee: Secondary | ICD-10-CM

## 2016-08-30 DIAGNOSIS — R252 Cramp and spasm: Secondary | ICD-10-CM

## 2016-08-30 DIAGNOSIS — M1731 Unilateral post-traumatic osteoarthritis, right knee: Secondary | ICD-10-CM

## 2016-08-30 NOTE — Patient Instructions (Signed)
Keep up the good work

## 2016-08-30 NOTE — Assessment & Plan Note (Signed)
HPI: Has found more affordable sildenafil.  This is helping  A: ED  P: Continue sildenafil

## 2016-08-30 NOTE — Assessment & Plan Note (Addendum)
HPI: taking 5mg  of amlodipine daily, no complaints. Cost is $9 a month> affordable  A: Essential HTN controlled  P: Continue Amlodipine 5mg  daily, discussed low salt diet Repeat BMP

## 2016-08-30 NOTE — Assessment & Plan Note (Signed)
HPI: Used to take Vit D supplements, no longer taking but is taking Centrum silver tablet daily.  A: Vitamin D deficiency  P: Repeat Vit D level

## 2016-08-30 NOTE — Assessment & Plan Note (Signed)
HPI: Ibuprofen helping. Takes PRN up to tid  A: OA of right knee- controlled  P: Continue PRN ibuprofen He did receive a lot of benefit from previous steroid injection can also repeat as needed

## 2016-08-30 NOTE — Assessment & Plan Note (Signed)
HPI: Doing well with Ambien, no current issues  A: Insomnia  P: Continue ambien.

## 2016-08-31 LAB — VITAMIN D 25 HYDROXY (VIT D DEFICIENCY, FRACTURES): VIT D 25 HYDROXY: 21.1 ng/mL — AB (ref 30.0–100.0)

## 2016-08-31 LAB — BMP8+ANION GAP
ANION GAP: 16 mmol/L (ref 10.0–18.0)
BUN/Creatinine Ratio: 14 (ref 10–24)
BUN: 14 mg/dL (ref 8–27)
CALCIUM: 9.3 mg/dL (ref 8.6–10.2)
CO2: 21 mmol/L (ref 20–29)
CREATININE: 0.97 mg/dL (ref 0.76–1.27)
Chloride: 105 mmol/L (ref 96–106)
GFR calc Af Amer: 94 mL/min/{1.73_m2} (ref >59)
GFR calc non Af Amer: 82 mL/min/{1.73_m2} (ref >59)
Glucose: 111 mg/dL — ABNORMAL HIGH (ref 65–99)
Potassium: 4 mmol/L (ref 3.5–5.2)
Sodium: 142 mmol/L (ref 134–144)

## 2016-10-01 MED FILL — ZOLPIDEM TARTRATE 10 MG TAB: 10 | 30 days supply | Qty: 30 | Fill #3

## 2016-10-31 ENCOUNTER — Other Ambulatory Visit: Payer: Self-pay | Admitting: *Deleted

## 2016-10-31 MED ORDER — ZOLPIDEM TARTRATE 10 MG PO TABS: ORAL_TABLET | ORAL | 3 refills | Status: DC

## 2016-10-31 MED FILL — ZOLPIDEM TARTRATE 10 MG TAB: 10 | 30 days supply | Qty: 30 | Fill #0

## 2016-10-31 NOTE — Telephone Encounter (Signed)
ambien called in to [facility])

## 2016-11-06 ENCOUNTER — Ambulatory Visit (INDEPENDENT_AMBULATORY_CARE_PROVIDER_SITE_OTHER): Payer: Medicare Other | Admitting: *Deleted

## 2016-11-06 ENCOUNTER — Encounter (INDEPENDENT_AMBULATORY_CARE_PROVIDER_SITE_OTHER): Payer: Self-pay

## 2016-11-06 DIAGNOSIS — Z23 Encounter for immunization: Secondary | ICD-10-CM

## 2016-11-26 MED FILL — AMLODIPINE BESYLATE 5 MG TA: 5 | 90 days supply | Qty: 90 | Fill #2

## 2016-11-28 MED FILL — ZOLPIDEM TARTRATE 10 MG TAB: 10 | 30 days supply | Qty: 30 | Fill #1

## 2016-12-31 MED FILL — ZOLPIDEM TARTRATE 10 MG TAB: 10 | 30 days supply | Qty: 30 | Fill #2

## 2017-01-29 MED FILL — ZOLPIDEM TARTRATE 10 MG TAB: 10 | 30 days supply | Qty: 30 | Fill #3

## 2017-01-30 NOTE — Progress Notes (Signed)
Subjective:  HPI: [name] is a 67 y.o. male who presents for f/u HTN, Hep C.   Please see Assessment and Plan below for the status of his chronic medical problems.  Review of Systems: Review of Systems  Constitutional: Positive for malaise/fatigue.  Eyes: Negative for blurred vision.  Respiratory: Negative for cough and shortness of breath.   Cardiovascular: Negative for chest pain and leg swelling.  Gastrointestinal: Negative for abdominal pain, diarrhea and melena.  Musculoskeletal: Negative for myalgias.  Endo/Heme/Allergies: Does not bruise/bleed easily.  Psychiatric/Behavioral: Negative for depression, memory loss and substance abuse. The patient has insomnia.     Objective:  Physical Exam: Vitals:   01/31/17 0823  BP: 139/74  Pulse: 82  Temp: 98 F (36.7 C)  TempSrc: Oral  SpO2: 97%  Weight: 227 lb 14.4 oz (103.4 kg)  Height: 5\' 10"  (1.778 m)   Physical Exam  Constitutional: He is well-developed, well-nourished, and in no distress.  Eyes: Conjunctivae are normal.  Cardiovascular: Normal rate and regular rhythm.  Pulmonary/Chest: Effort normal and breath sounds normal.  Abdominal: Soft. Bowel sounds are normal. He exhibits no distension and no mass. There is no tenderness. There is no guarding.  No organomegaly  Musculoskeletal: He exhibits no edema.  Psychiatric: Affect and judgment normal.  Nursing note and vitals reviewed.  Assessment & Plan:  Essential hypertension HPI: No complaints.  A: Essential HTN, reasonably controlled  P: Continue amlodipine 5mg  daily  Hepatitis C, chronic - genotype 2 HPI: Patient remains concerned about his Hep C.  He is interested and motivated to pursue treatment but concerned with cost of medication and specialists.  He does have some mild fatigue- attributes to occasional poor sleep.  Otherwise asymptomatic.  He has no evidence of cirrhosis. He was previously evaluated for his Hep C by Dr Carlean Purl if Feb 2014, deemed  to be a good candidate for direct antiviral therapy but could not afford at that time.  I did obtain a U/S with elastography last year that showed F2/F3.  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 -Will repeat vl at week 4 of treatment.    Vitamin D deficiency -recheck vit d  Insomnia HPI: reports doing well with Lorrin Mais, gets at least 6 hours of sleep when he takes it.  Still doing some bad habits- leaving TV on during sleep.  A: Insomnia  P: He is now 15, discussed with him reducing dose to 5mg  of Ambien.  He otherwise has tolerated this without adverse effects.  Erectile dysfunction Has not been sexually active lately.  Slidinaphil has been too expensive once he has had insurance of the last 2 years or so.  A: ED  P: Sildenafil as needed, will call and request a Rx if he needs.   Medications Ordered Meds ordered this encounter  Medications  . zolpidem (AMBIEN) 5 MG tablet    Sig: TAKE 1 TABLET BY MOUTH ONCE DAILY AT BEDTIME AS NEEDED FOR SLEEP    Dispense:  30 tablet    Refill:  5  . Glecaprevir-Pibrentasvir (MAVYRET) 100-40 MG TABS    Sig: Take 3 tablets by mouth daily.    Dispense:  84  tablet    Refill:  1   Other Orders Orders Placed This Encounter  Procedures  . Pneumococcal conjugate vaccine 13-valent  . CBC with Diff  . CMP14 + Anion Gap  . HIV antibody (with reflex)  . Lipid Profile  . Vitamin D (25 hydroxy)  . Protime-INR  . HCV RNA quant  . Amb Referral to Clinical Pharmacist    Referral Priority:   Routine    Referral Type:   Consultation    Number of Visits Requested:   1   Follow Up: Return in about 6  months (around 07/31/2017).  Will see clinical pharmacy sooner for education prior to starting hep c treatment.

## 2017-01-31 ENCOUNTER — Ambulatory Visit (INDEPENDENT_AMBULATORY_CARE_PROVIDER_SITE_OTHER): Payer: PPO | Admitting: Internal Medicine

## 2017-01-31 ENCOUNTER — Other Ambulatory Visit: Payer: Self-pay

## 2017-01-31 ENCOUNTER — Encounter: Payer: Self-pay | Admitting: Internal Medicine

## 2017-01-31 VITALS — BP 139/74 | HR 82 | Temp 98.0°F | Ht 70.0 in | Wt 227.9 lb

## 2017-01-31 DIAGNOSIS — Z79899 Other long term (current) drug therapy: Secondary | ICD-10-CM | POA: Diagnosis not present

## 2017-01-31 DIAGNOSIS — Z23 Encounter for immunization: Secondary | ICD-10-CM

## 2017-01-31 DIAGNOSIS — R5381 Other malaise: Secondary | ICD-10-CM | POA: Diagnosis not present

## 2017-01-31 DIAGNOSIS — G4700 Insomnia, unspecified: Secondary | ICD-10-CM | POA: Diagnosis not present

## 2017-01-31 DIAGNOSIS — B182 Chronic viral hepatitis C: Secondary | ICD-10-CM

## 2017-01-31 DIAGNOSIS — Z9189 Other specified personal risk factors, not elsewhere classified: Secondary | ICD-10-CM | POA: Diagnosis not present

## 2017-01-31 DIAGNOSIS — I10 Essential (primary) hypertension: Secondary | ICD-10-CM

## 2017-01-31 DIAGNOSIS — N529 Male erectile dysfunction, unspecified: Secondary | ICD-10-CM | POA: Diagnosis not present

## 2017-01-31 DIAGNOSIS — R5383 Other fatigue: Secondary | ICD-10-CM | POA: Diagnosis not present

## 2017-01-31 DIAGNOSIS — E559 Vitamin D deficiency, unspecified: Secondary | ICD-10-CM | POA: Diagnosis not present

## 2017-01-31 DIAGNOSIS — Z87891 Personal history of nicotine dependence: Secondary | ICD-10-CM | POA: Diagnosis not present

## 2017-01-31 LAB — PROTIME-INR
INR: 1.17
Prothrombin Time: 14.8 seconds (ref 11.4–15.2)

## 2017-01-31 MED ORDER — ZOLPIDEM TARTRATE 5 MG PO TABS: ORAL_TABLET | ORAL | 5 refills | Status: DC

## 2017-01-31 NOTE — Patient Instructions (Addendum)
I am going to work on getting you treatment for you Hepatitis C.    If you want to read about it more the two medications we use are Mavyret or Epclusa.  Ask about the Shingrix vaccine.

## 2017-02-01 LAB — HCV RNA QUANT
HCV log10: 4.982 log10 IU/mL
Hepatitis C Quantitation: 96000 IU/mL

## 2017-02-01 LAB — CBC WITH DIFFERENTIAL/PLATELET
BASOS: 1 %
Basophils Absolute: 0 10*3/uL (ref 0.0–0.2)
EOS (ABSOLUTE): 0.1 10*3/uL (ref 0.0–0.4)
Eos: 3 %
HEMATOCRIT: 44.1 % (ref 37.5–51.0)
Hemoglobin: 14.7 g/dL (ref 13.0–17.7)
IMMATURE GRANS (ABS): 0 10*3/uL (ref 0.0–0.1)
Immature Granulocytes: 0 %
Lymphocytes Absolute: 2 10*3/uL (ref 0.7–3.1)
Lymphs: 48 %
MCH: 31.8 pg (ref 26.6–33.0)
MCHC: 33.3 g/dL (ref 31.5–35.7)
MCV: 96 fL (ref 79–97)
MONOCYTES: 9 %
MONOS ABS: 0.4 10*3/uL (ref 0.1–0.9)
Neutrophils Absolute: 1.6 10*3/uL (ref 1.4–7.0)
Neutrophils: 39 %
PLATELETS: 246 10*3/uL (ref 150–379)
RBC: 4.62 x10E6/uL (ref 4.14–5.80)
RDW: 13.5 % (ref 12.3–15.4)
WBC: 4.2 10*3/uL (ref 3.4–10.8)

## 2017-02-01 LAB — CMP14 + ANION GAP
ALK PHOS: 70 IU/L (ref 39–117)
ALT: 85 IU/L — AB (ref 0–44)
ANION GAP: 16 mmol/L (ref 10.0–18.0)
AST: 53 IU/L — AB (ref 0–40)
Albumin/Globulin Ratio: 1.3 (ref 1.2–2.2)
Albumin: 4.4 g/dL (ref 3.6–4.8)
BUN/Creatinine Ratio: 9 — ABNORMAL LOW (ref 10–24)
BUN: 9 mg/dL (ref 8–27)
Bilirubin Total: 0.5 mg/dL (ref 0.0–1.2)
CALCIUM: 9.3 mg/dL (ref 8.6–10.2)
CO2: 20 mmol/L (ref 20–29)
CREATININE: 0.95 mg/dL (ref 0.76–1.27)
Chloride: 104 mmol/L (ref 96–106)
GFR calc Af Amer: 97 mL/min/{1.73_m2} (ref >59)
GFR, EST NON AFRICAN AMERICAN: 84 mL/min/{1.73_m2} (ref >59)
Globulin, Total: 3.4 g/dL (ref 1.5–4.5)
Glucose: 110 mg/dL — ABNORMAL HIGH (ref 65–99)
Potassium: 4.1 mmol/L (ref 3.5–5.2)
Sodium: 140 mmol/L (ref 134–144)
Total Protein: 7.8 g/dL (ref 6.0–8.5)

## 2017-02-01 LAB — LIPID PANEL
CHOLESTEROL TOTAL: 154 mg/dL (ref 100–199)
Chol/HDL Ratio: 4.8 ratio (ref 0.0–5.0)
HDL: 32 mg/dL — AB (ref >39)
LDL Calculated: 108 mg/dL — ABNORMAL HIGH (ref 0–99)
TRIGLYCERIDES: 69 mg/dL (ref 0–149)
VLDL Cholesterol Cal: 14 mg/dL (ref 5–40)

## 2017-02-01 LAB — HIV ANTIBODY (ROUTINE TESTING W REFLEX): HIV SCREEN 4TH GENERATION: NONREACTIVE

## 2017-02-01 LAB — VITAMIN D 25 HYDROXY (VIT D DEFICIENCY, FRACTURES): Vit D, 25-Hydroxy: 13.7 ng/mL — ABNORMAL LOW (ref 30.0–100.0)

## 2017-02-01 MED ORDER — GLECAPREVIR-PIBRENTASVIR 100-40 MG PO TABS: 3.0000 | ORAL_TABLET | Freq: Every day | ORAL | 1 refills | Status: DC

## 2017-02-01 NOTE — Assessment & Plan Note (Signed)
Has not been sexually active lately.  Slidinaphil has been too expensive once he has had insurance of the last 2 years or so.  A: ED  P: Sildenafil as needed, will call and request a Rx if he needs.

## 2017-02-01 NOTE — Assessment & Plan Note (Signed)
HPI: No complaints.  A: Essential HTN, reasonably controlled  P: Continue amlodipine 5mg  daily

## 2017-02-01 NOTE — Assessment & Plan Note (Addendum)
HPI: Patient remains concerned about his Hep C.  He is interested and motivated to pursue treatment but concerned with cost of medication and specialists.  He does have some mild fatigue- attributes to occasional poor sleep.  Otherwise asymptomatic.  He has no evidence of cirrhosis. He was previously evaluated for his Hep C by Dr Carlean Purl if Feb 2014, deemed to be a good candidate for direct antiviral therapy but could not afford at that time.  I did obtain a U/S with elastography last year that showed F2/F3.  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 -Will repeat vl at week 4 of treatment.

## 2017-02-01 NOTE — Assessment & Plan Note (Signed)
-  recheck vit d

## 2017-02-01 NOTE — Assessment & Plan Note (Signed)
Checked lipid panel today,  Lipid Panel     Component Value Date/Time   CHOL 154 01/31/2017 0923   TRIG 69 01/31/2017 0923   HDL 32 (L) 01/31/2017 0923   CHOLHDL 4.8 01/31/2017 0923   CHOLHDL 4.8 12/21/2011 1102   VLDL 17 12/21/2011 1102   LDLCALC 108 (H) 01/31/2017 0923   Overall reasonable, ASCVD 10 year risk calculated at 19.6 resonable to start statin (previously on pravastatin) will delay this for now until we treat Hep C.

## 2017-02-01 NOTE — Assessment & Plan Note (Signed)
HPI: reports doing well with Lorrin Mais, gets at least 6 hours of sleep when he takes it.  Still doing some bad habits- leaving TV on during sleep.  A: Insomnia  P: He is now 60, discussed with him reducing dose to 5mg  of Ambien.  He otherwise has tolerated this without adverse effects.

## 2017-02-12 ENCOUNTER — Telehealth: Payer: Self-pay | Admitting: Pharmacist

## 2017-02-13 NOTE — Telephone Encounter (Signed)
Working on [employer] patient assistance application, need to speak with patient but unable to reach. Left message for patient to call back.

## 2017-02-19 ENCOUNTER — Telehealth: Payer: Self-pay

## 2017-02-19 NOTE — Telephone Encounter (Signed)
Stated he received paperwork for [place]; unsure what to do with this application. He has a appt to to see Mannie Stabile on the 12th. I will leave message for Mannie Stabile.

## 2017-02-19 NOTE — Telephone Encounter (Signed)
Needs to speak with a nurse about med. Please call pt back.  

## 2017-02-20 NOTE — Telephone Encounter (Signed)
Thank you, Holley Raring! I was out a couple of days but will contact patient as soon as I can.

## 2017-02-26 ENCOUNTER — Ambulatory Visit: Payer: PPO | Admitting: Pharmacist

## 2017-02-26 ENCOUNTER — Encounter: Payer: PPO | Admitting: Pharmacist

## 2017-02-26 DIAGNOSIS — B182 Chronic viral hepatitis C: Secondary | ICD-10-CM

## 2017-02-26 NOTE — Progress Notes (Signed)
S: Scott Burgess is a 67 y.o. male reports to clinical pharmacist appointment for assistance with Hepatitis C treatment.  No Known Allergies Medication Sig  amLODipine (NORVASC) 5 MG tablet Take 1 tablet (5 mg total) by mouth daily.  aspirin 81 MG tablet Take 81 mg by mouth daily.   Cholecalciferol (VITAMIN D3) 1000 UNITS CAPS Take 1 capsule by mouth daily. Reported on 08/04/2015  Glecaprevir-Pibrentasvir (MAVYRET) 100-40 MG TABS Take 3 tablets by mouth daily.  ibuprofen (ADVIL,MOTRIN) 800 MG tablet Take 1 tablet (800 mg total) by mouth every 8 (eight) hours as needed.  Multiple Vitamins-Minerals (CENTRUM SILVER ADULT 50+) TABS Take 1 tablet by mouth daily.  omega-3 acid ethyl esters (LOVAZA) 1 g capsule Take by mouth daily.  sildenafil (REVATIO) 20 MG tablet Take 2-5 tablets as needed for sexual activity.  zolpidem (AMBIEN) 5 MG tablet TAKE 1 TABLET BY MOUTH ONCE DAILY AT BEDTIME AS NEEDED FOR SLEEP   Past Medical History:  Diagnosis Date  . Hepatitis C, chronic (HCC)    genotype 2  . Hernia   . Hyperlipidemia   . Hypertension   . Osteoarthritis of right knee 02/11/2014  . Personal history of colonic adenoma 03/17/2012  . Pneumonia   . STD (male)    Social History   Socioeconomic History  . Marital status: Single    Spouse name: Not on file  . Number of children: 3  . Years of education: Not on file  . Highest education level: Not on file  Social Needs  . Financial resource strain: Not on file  . Food insecurity - worry: Not on file  . Food insecurity - inability: Not on file  . Transportation needs - medical: Not on file  . Transportation needs - non-medical: Not on file  Occupational History  . Occupation: Airline pilot: [employer]  Tobacco Use  . Smoking status: Former Smoker    Last attempt to quit: 01/15/2005    Years since quitting: 12.1  . Smokeless tobacco: Never Used  Substance and Sexual Activity  . Alcohol use: No    Alcohol/week: 0.0 oz  . Drug use:  No  . Sexual activity: Not on file  Other Topics Concern  . Not on file  Social History Narrative  . Not on file   Family History  Problem Relation Age of Onset  . Diabetes Mother   . Diabetes Maternal Grandmother    O: Component Value Date/Time   CHOL 154 01/31/2017 0923   HDL 32 (L) 01/31/2017 0923   TRIG 69 01/31/2017 0923   AST 53 (H) 01/31/2017 0923   ALT 85 (H) 01/31/2017 0923   NA 140 01/31/2017 0923   K 4.1 01/31/2017 0923   CL 104 01/31/2017 0923   CO2 20 01/31/2017 0923   GLUCOSE 110 (H) 01/31/2017 0923   GLUCOSE 91 05/13/2014 1638   HGBA1C  12/09/2008 1729    5.5 (NOTE) The ADA recommends the following therapeutic goal for glycemic control related to Hgb A1c measurement: Goal of therapy: <6.5 Hgb A1c  Reference: American Diabetes Association: Clinical Practice Recommendations 2010, Diabetes Care, 2010, 33: (Suppl  1).   BUN 9 01/31/2017 0923   CREATININE 0.95 01/31/2017 0923   CREATININE 0.91 05/13/2014 1638   CALCIUM 9.3 01/31/2017 0923   GFRNONAA 84 01/31/2017 0923   GFRNONAA 89 05/13/2014 1638   GFRAA 97 01/31/2017 0923   GFRAA >89 05/13/2014 1638   WBC 4.2 01/31/2017 0923   WBC 12.4 (H) 07/08/2012  0805   HGB 14.7 01/31/2017 0923   HCT 44.1 01/31/2017 0923   PLT 246 01/31/2017 0923   TSH 1.973 10/18/2006 2009   Ht Readings from Last 2 Encounters:  01/31/17 5\' 10"  (1.778 m)  08/30/16 5\' 10"  (1.778 m)   Wt Readings from Last 2 Encounters:  01/31/17 227 lb 14.4 oz (103.4 kg)  08/30/16 224 lb (101.6 kg)   There is no height or weight on file to calculate BMI. BP Readings from Last 3 Encounters:  01/31/17 139/74  08/30/16 139/86  05/24/16 (!) 150/79   A/P: Chronic Hep C genotype 2 without cirrhosis Plan to initiate therapy with Mavyret, will coordinate access for patient. Provided education on Mavyret, including instructions,goals of therapy, potential side effects, and importance of adherence. Patient verbalized understanding by repeating back  information and was advised to contact me if further medication-related questions arise. Patient was also provided an information handout. An after visit summary was provided and patient advised to follow up if any changes in condition or questions regarding medications arise.

## 2017-02-27 MED FILL — MAVYRET 100-40 MG TABS: 100-40 | 28 days supply | Qty: 84 | Fill #0

## 2017-03-01 MED FILL — ZOLPIDEM TARTRATE 5 MG TAB: 5 | 30 days supply | Qty: 30 | Fill #0

## 2017-03-01 MED FILL — AMLODIPINE BESYLATE 5 MG TA: 5 | 90 days supply | Qty: 90 | Fill #3

## 2017-03-11 ENCOUNTER — Telehealth: Payer: Self-pay | Admitting: Internal Medicine

## 2017-03-11 NOTE — Telephone Encounter (Signed)
Patient would like a call back about his  zolpidem (AMBIEN) 5 MG tablet pt states insurance won't pay for it.

## 2017-03-19 ENCOUNTER — Telehealth: Payer: Self-pay | Admitting: *Deleted

## 2017-03-19 NOTE — Telephone Encounter (Signed)
PaA has been done.  Awaiting decision.

## 2017-03-19 NOTE — Telephone Encounter (Addendum)
Information was sent to [facility] for PA for Zolpidem 5 mg tablets.  To await decision within 24 hours. [facility] [phone].   Sander Nephew, RN 03/19/2017  2:33 PM

## 2017-03-26 ENCOUNTER — Telehealth: Payer: Self-pay | Admitting: Pharmacist

## 2017-03-26 NOTE — Progress Notes (Addendum)
Patient called for follow up to [facility] therapy. Patient is finishing week 4 of therapy, reports no side effects of concern. Patient states another prior Josem Kaufmann is needed for a refill (due in 2 days). Contacted insurance as well as pharmacy to successfully complete the prior auth.

## 2017-03-27 ENCOUNTER — Telehealth: Payer: Self-pay | Admitting: Internal Medicine

## 2017-03-27 DIAGNOSIS — B182 Chronic viral hepatitis C: Secondary | ICD-10-CM

## 2017-03-27 NOTE — Telephone Encounter (Signed)
I was informed by our clinical pharmacist today that Scott Burgess was unable to refill Mayvert for the second 4 weeks of his treatment course by our clinical pharmacist Dr Maudie Mercury.  We were initially able to attain patient assistance for the first 4 weeks but he know needs to refill for the second 4 weeks to complete the 8 week treatment course.  Dr Maudie Mercury informed me that his insurance/patient assitance required that as I am not a ID or GI doctor that I needed to have one approve of the treatment plan.  I discussed his case with Dr Baxter Flattery, of infectious disease.    To recap: Scott Burgess is a 67 yo male with chronic hepatitis C.  He has elevated AST and ALT due to chronic hep C, his U/S with elastography completed in March 2017 revealed a Fibrosis score of F2/3.  He has no radiographic or clinical signs or symptoms of cirrhosis.  HIV was negative Jan 2019, he does not have Hepatitis B or A and has documented immunity.  His HCV viral load was 96,000 in Jan 2019.  He has genotype 2.  Per clinical guidelines he was started on an 8 week course of glecaprevir-pibrentasvir 300-120mg  ([place]) daily.  We plan to obtain a repeat CMP and Hep C viral load now at 4 weeks of therapy.  Will repeat at 12 weeks following therapy.  Scott Groves, DO

## 2017-03-27 NOTE — Telephone Encounter (Signed)
I have reviewed the patient's case and agree with the plan outlined by Dr Heber Yonah. Scott Burgess has finished 1 month of mavryet but will need an additional 4 weeks to complete 8 week course of treatment for HCV GT2. Agree with plan to check labs at end of treatment and SVR12 to document treatment success. Will need repeat U/S Q 6 months for [facility] surveillance  Catelyn Friel [facility] [place] for Infectious Diseases [phone]

## 2017-03-28 MED FILL — MAVYRET 100-40 MG TABS: 100-40 | 28 days supply | Qty: 84 | Fill #1

## 2017-04-01 ENCOUNTER — Telehealth: Payer: Self-pay | Admitting: *Deleted

## 2017-04-01 MED FILL — ZOLPIDEM TARTRATE 5 MG TAB: 5 | 30 days supply | Qty: 30 | Fill #1

## 2017-04-01 NOTE — Telephone Encounter (Signed)
Fax from [facility] 100-40 mg tablets # [phone] has been approved for patient 03/27/2017 through 04/24/2017.  Sander Nephew, RN 04/01/2017 11:47 AM.

## 2017-04-02 ENCOUNTER — Other Ambulatory Visit (INDEPENDENT_AMBULATORY_CARE_PROVIDER_SITE_OTHER): Payer: PPO

## 2017-04-02 DIAGNOSIS — B182 Chronic viral hepatitis C: Secondary | ICD-10-CM

## 2017-04-03 LAB — CMP14 + ANION GAP
A/G RATIO: 1.3 (ref 1.2–2.2)
ALBUMIN: 4.1 g/dL (ref 3.6–4.8)
ALT: 19 IU/L (ref 0–44)
AST: 33 IU/L (ref 0–40)
Alkaline Phosphatase: 69 IU/L (ref 39–117)
Anion Gap: 17 mmol/L (ref 10.0–18.0)
BUN / CREAT RATIO: 17 (ref 10–24)
BUN: 16 mg/dL (ref 8–27)
Bilirubin Total: 0.5 mg/dL (ref 0.0–1.2)
CALCIUM: 9.3 mg/dL (ref 8.6–10.2)
CO2: 19 mmol/L — ABNORMAL LOW (ref 20–29)
Chloride: 104 mmol/L (ref 96–106)
Creatinine, Ser: 0.92 mg/dL (ref 0.76–1.27)
GFR, EST AFRICAN AMERICAN: 101 mL/min/{1.73_m2} (ref >59)
GFR, EST NON AFRICAN AMERICAN: 87 mL/min/{1.73_m2} (ref >59)
Globulin, Total: 3.2 g/dL (ref 1.5–4.5)
Glucose: 142 mg/dL — ABNORMAL HIGH (ref 65–99)
POTASSIUM: 4.2 mmol/L (ref 3.5–5.2)
Sodium: 140 mmol/L (ref 134–144)
TOTAL PROTEIN: 7.3 g/dL (ref 6.0–8.5)

## 2017-04-03 LAB — HCV RNA QUANT: Hepatitis C Quantitation: NOT DETECTED IU/mL

## 2017-05-01 MED FILL — ZOLPIDEM TARTRATE 5 MG TAB: 5 | 30 days supply | Qty: 30 | Fill #2

## 2017-05-31 MED FILL — ZOLPIDEM TARTRATE 5 MG TAB: 5 | 30 days supply | Qty: 30 | Fill #3

## 2017-06-04 ENCOUNTER — Other Ambulatory Visit: Payer: Self-pay | Admitting: *Deleted

## 2017-06-04 MED ORDER — AMLODIPINE BESYLATE 5 MG PO TABS: 5.0000 mg | ORAL_TABLET | Freq: Every day | ORAL | 3 refills | Status: DC

## 2017-06-04 MED FILL — AMLODIPINE BESYLATE 5 MG TA: 5 | 90 days supply | Qty: 90 | Fill #0

## 2017-06-06 ENCOUNTER — Ambulatory Visit (INDEPENDENT_AMBULATORY_CARE_PROVIDER_SITE_OTHER): Payer: PPO | Admitting: Internal Medicine

## 2017-06-06 ENCOUNTER — Other Ambulatory Visit: Payer: Self-pay

## 2017-06-06 ENCOUNTER — Encounter: Payer: Self-pay | Admitting: Internal Medicine

## 2017-06-06 VITALS — BP 146/86 | HR 81 | Temp 98.2°F | Ht 70.0 in | Wt 230.7 lb

## 2017-06-06 DIAGNOSIS — K089 Disorder of teeth and supporting structures, unspecified: Secondary | ICD-10-CM | POA: Insufficient documentation

## 2017-06-06 DIAGNOSIS — K740 Hepatic fibrosis: Secondary | ICD-10-CM

## 2017-06-06 DIAGNOSIS — K0889 Other specified disorders of teeth and supporting structures: Secondary | ICD-10-CM | POA: Diagnosis not present

## 2017-06-06 DIAGNOSIS — E559 Vitamin D deficiency, unspecified: Secondary | ICD-10-CM

## 2017-06-06 DIAGNOSIS — I10 Essential (primary) hypertension: Secondary | ICD-10-CM | POA: Diagnosis not present

## 2017-06-06 DIAGNOSIS — G4700 Insomnia, unspecified: Secondary | ICD-10-CM | POA: Diagnosis not present

## 2017-06-06 DIAGNOSIS — Z79899 Other long term (current) drug therapy: Secondary | ICD-10-CM | POA: Diagnosis not present

## 2017-06-06 DIAGNOSIS — Z9189 Other specified personal risk factors, not elsewhere classified: Secondary | ICD-10-CM

## 2017-06-06 DIAGNOSIS — B182 Chronic viral hepatitis C: Secondary | ICD-10-CM

## 2017-06-06 NOTE — Patient Instructions (Addendum)
I want you to get an ultrasound of your liver before I see you next.  I also want you to return for a clinic appointment on July 15th (around that date) to ensure the hepatitis C is cured.  I want you to be fasting for this test (no food or drink other than pure water after midnight).

## 2017-06-07 NOTE — Progress Notes (Signed)
Subjective:  HPI: [name] is a 67 y.o. male who presents for chronic hepatitis C and hypertension   Please see Assessment and Plan below for the status of his chronic medical problems.  Review of Systems: Review of Systems  Constitutional: Negative for fever and malaise/fatigue.  Respiratory: Negative for cough and shortness of breath.   Cardiovascular: Negative for chest pain.  Genitourinary: Negative for dysuria and frequency.  Musculoskeletal: Negative for myalgias.  Neurological: Negative for dizziness and headaches.  Endo/Heme/Allergies: Negative for polydipsia.    Objective:  Physical Exam: Vitals:   06/06/17 0911  BP: (!) 146/86  Pulse: 81  Temp: 98.2 F (36.8 C)  TempSrc: Oral  SpO2: 97%  Weight: 230 lb 11.2 oz (104.6 kg)  Height: 5\' 10"  (1.778 m)   Physical Exam  Constitutional: He appears well-developed and well-nourished.  HENT:  Head: Normocephalic and atraumatic.  Mouth/Throat: Oropharynx is clear and moist.  Cardiovascular: Normal rate, regular rhythm and normal heart sounds.  Pulmonary/Chest: Effort normal and breath sounds normal.  Abdominal: Soft. Bowel sounds are normal. He exhibits no distension. There is no tenderness.  Musculoskeletal: He exhibits no edema.  Psychiatric: He has a normal mood and affect.  Nursing note and vitals reviewed.  Assessment & Plan:  Essential hypertension HPI: Patient reports she is doing well on amlodipine 5 mg daily no side effects.   Assessment essential hypertension  Plan Continue amlodipine 5 mg daily blood pressure is slightly above goal however he missed his dose this morning we will plan to continue him on current dose until our next follow-up. -I asked him to be fasting for his CMP as well as check lipid panel I will also screen for diabetes with an A1c at his upcoming labs in July  Hepatitis C, chronic - genotype 2 HPI: He completed 8-week course of Maverick on April 15 he reports he tolerated the  course well he had no side effects.  At the halfway mark his viral load was undetectable.    Assessment: Chronic hepatitis C genotype 2 without cirrhosis, fibrosis score of 2/3  Plan -I placed an order for repeat right upper quadrant ultrasound to assess liver given he has some fibrosis he will need ongoing screening for hepatocellular carcinoma ideally at q. 33-month intervals. -I placed orders for repeat CMP and Hep C viral load to be completed around July 15 this for his 12-week sustaining a viral response.   Poor dentition HPI: He notes that he has multiple tooth problems he is overdue for seeing the dentist his insurance does cover dental care he has no acute complaints today.  Assessment: Poor dentition  Plan: Referral to dentistry  Insomnia Reports doing very well daily with Ambien 10 mg daily I discussed with him the he should try to start cutting back on use of Ambien to see if he can fall asleep without its use.  Candidate for statin therapy due to risk of future cardiovascular event Previously felt pravastatin was interfering with sleep and we held this medication since it was for preventative.  He is willing to restart it we discussed restarting at 10 mg dose will have him continue pravastatin 10 mg we will check a fasting lipid panel with his labs in July.  Vitamin D deficiency He is taking over-the-counter vitamin D3 1000 units daily. Check vitamin D with next labs.   Medications Ordered No orders of the defined types were placed in this encounter.  Other Orders Orders Placed This Encounter  Procedures  .  US Abdomen Limited RUQ    Standing Status:   Future    Standing Expiration Date:   06/07/2018    Order Specific Question:   Reason for Exam (SYMPTOM  OR DIAGNOSIS REQUIRED)    Answer:   Chronic Hepatitis C, F2/3    Order Specific Question:   Preferred imaging location?    Answer:   [facility]  . HCV RNA quant    Standing Status:   Future    Standing  Expiration Date:   09/06/2017  . CMP14 + Anion Gap    Standing Status:   Future    Standing Expiration Date:   09/06/2017  . Hemoglobin A1c    Standing Status:   Future    Standing Expiration Date:   06/07/2018  . Lipid Profile    Standing Status:   Future    Standing Expiration Date:   09/06/2017  . Vitamin D (25 hydroxy)    Standing Status:   Future    Standing Expiration Date:   09/07/2017   Follow Up: Return 3-4 months with Dr Heber [place].

## 2017-06-07 NOTE — Assessment & Plan Note (Signed)
He is taking over-the-counter vitamin D3 1000 units daily. Check vitamin D with next labs.

## 2017-06-07 NOTE — Assessment & Plan Note (Signed)
Previously felt pravastatin was interfering with sleep and we held this medication since it was for preventative.  He is willing to restart it we discussed restarting at 10 mg dose will have him continue pravastatin 10 mg we will check a fasting lipid panel with his labs in July.

## 2017-06-07 NOTE — Assessment & Plan Note (Signed)
HPI: He completed 8-week course of Maverick on April 15 he reports he tolerated the course well he had no side effects.  At the halfway mark his viral load was undetectable.    Assessment: Chronic hepatitis C genotype 2 without cirrhosis, fibrosis score of 2/3  Plan -I placed an order for repeat right upper quadrant ultrasound to assess liver given he has some fibrosis he will need ongoing screening for hepatocellular carcinoma ideally at q. 57-month intervals. -I placed orders for repeat CMP and Hep C viral load to be completed around July 15 this for his 12-week sustaining a viral response.

## 2017-06-07 NOTE — Assessment & Plan Note (Signed)
HPI: He notes that he has multiple tooth problems he is overdue for seeing the dentist his insurance does cover dental care he has no acute complaints today.  Assessment: Poor dentition  Plan: Referral to dentistry

## 2017-06-07 NOTE — Assessment & Plan Note (Addendum)
HPI: Patient reports she is doing well on amlodipine 5 mg daily no side effects.   Assessment essential hypertension  Plan Continue amlodipine 5 mg daily blood pressure is slightly above goal however he missed his dose this morning we will plan to continue him on current dose until our next follow-up. -I asked him to be fasting for his CMP as well as check lipid panel I will also screen for diabetes with an A1c at his upcoming labs in July

## 2017-06-07 NOTE — Assessment & Plan Note (Signed)
Reports doing very well daily with Ambien 10 mg daily I discussed with him the he should try to start cutting back on use of Ambien to see if he can fall asleep without its use.

## 2017-07-01 MED FILL — ZOLPIDEM TARTRATE 5 MG TAB: 5 | 30 days supply | Qty: 30 | Fill #4

## 2017-07-09 ENCOUNTER — Ambulatory Visit (HOSPITAL_COMMUNITY): Payer: PPO

## 2017-07-15 ENCOUNTER — Ambulatory Visit (HOSPITAL_COMMUNITY)
Admission: RE | Admit: 2017-07-15 | Discharge: 2017-07-15 | Disposition: A | Discharge status: Home / Self Care | Payer: PPO | Source: Ambulatory Visit | Attending: Internal Medicine | Admitting: Internal Medicine

## 2017-07-15 DIAGNOSIS — B182 Chronic viral hepatitis C: Secondary | ICD-10-CM | POA: Insufficient documentation

## 2017-07-15 DIAGNOSIS — K7689 Other specified diseases of liver: Secondary | ICD-10-CM | POA: Insufficient documentation

## 2017-07-29 ENCOUNTER — Other Ambulatory Visit (INDEPENDENT_AMBULATORY_CARE_PROVIDER_SITE_OTHER): Payer: PPO

## 2017-07-29 DIAGNOSIS — E559 Vitamin D deficiency, unspecified: Secondary | ICD-10-CM | POA: Diagnosis not present

## 2017-07-29 DIAGNOSIS — B182 Chronic viral hepatitis C: Secondary | ICD-10-CM

## 2017-07-29 DIAGNOSIS — Z9189 Other specified personal risk factors, not elsewhere classified: Secondary | ICD-10-CM | POA: Diagnosis not present

## 2017-07-29 DIAGNOSIS — I10 Essential (primary) hypertension: Secondary | ICD-10-CM

## 2017-07-29 MED FILL — ZOLPIDEM TARTRATE 5 MG TAB: 5 | 30 days supply | Qty: 30 | Fill #5

## 2017-07-30 LAB — LIPID PANEL
CHOLESTEROL TOTAL: 176 mg/dL (ref 100–199)
Chol/HDL Ratio: 5.3 ratio — ABNORMAL HIGH (ref 0.0–5.0)
HDL: 33 mg/dL — AB (ref >39)
LDL Calculated: 129 mg/dL — ABNORMAL HIGH (ref 0–99)
TRIGLYCERIDES: 69 mg/dL (ref 0–149)
VLDL CHOLESTEROL CAL: 14 mg/dL (ref 5–40)

## 2017-07-30 LAB — CMP14 + ANION GAP
ALBUMIN: 4.4 g/dL (ref 3.6–4.8)
ALK PHOS: 65 IU/L (ref 39–117)
ALT: 29 IU/L (ref 0–44)
ANION GAP: 12 mmol/L (ref 10.0–18.0)
AST: 25 IU/L (ref 0–40)
Albumin/Globulin Ratio: 1.4 (ref 1.2–2.2)
BUN/Creatinine Ratio: 17 (ref 10–24)
BUN: 15 mg/dL (ref 8–27)
Bilirubin Total: 0.4 mg/dL (ref 0.0–1.2)
CALCIUM: 9.3 mg/dL (ref 8.6–10.2)
CHLORIDE: 108 mmol/L — AB (ref 96–106)
CO2: 21 mmol/L (ref 20–29)
Creatinine, Ser: 0.9 mg/dL (ref 0.76–1.27)
GFR calc Af Amer: 103 mL/min/{1.73_m2} (ref >59)
GFR calc non Af Amer: 89 mL/min/{1.73_m2} (ref >59)
Globulin, Total: 3.2 g/dL (ref 1.5–4.5)
Glucose: 103 mg/dL — ABNORMAL HIGH (ref 65–99)
POTASSIUM: 4 mmol/L (ref 3.5–5.2)
SODIUM: 141 mmol/L (ref 134–144)
Total Protein: 7.6 g/dL (ref 6.0–8.5)

## 2017-07-30 LAB — HEMOGLOBIN A1C
Est. average glucose Bld gHb Est-mCnc: 111 mg/dL
Hgb A1c MFr Bld: 5.5 % (ref 4.8–5.6)

## 2017-07-30 LAB — VITAMIN D 25 HYDROXY (VIT D DEFICIENCY, FRACTURES): VIT D 25 HYDROXY: 26.6 ng/mL — AB (ref 30.0–100.0)

## 2017-07-31 ENCOUNTER — Telehealth: Payer: Self-pay | Admitting: Student in an Organized Health Care Education/Training Program

## 2017-07-31 LAB — HCV RNA QUANT: Hepatitis C Quantitation: NOT DETECTED IU/mL

## 2017-07-31 NOTE — Telephone Encounter (Signed)
I spoke with Scott Burgess and gave him the good news that HCV viral load is still undetectable 3 months after treatment. Liver function also normal. Looks like a nice sustained viral response to treatment.

## 2017-08-29 ENCOUNTER — Other Ambulatory Visit: Payer: Self-pay | Admitting: *Deleted

## 2017-08-29 NOTE — Patient Outreach (Addendum)
[facility]) Care Burgess  08/29/2017  Scott Burgess Jan 06, 1951 [id number]  Successful initial telephone outreach to review health risk assessment and screen for care Burgess needs for  [employer]. Scott Burgess states he recently received the wonderful news that his chronic Hepatitis C was cured with the drug Maverick. He says he is very grateful to his providers and the pharmacist who assisted with getting him the drug at no cost. 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 He says he is very fortunate to have been able to stop his drug use. He says he has a diagnosis of high blood pressure abut takes medication he can afford and is consistently adherent with taking the medication.  He says he does not monitor his blood pressure at home but states his pressure is good when it is taken at his providers' office.  He says he has very bad teeth and needs to have whole mouth extraction and be fitted for dentures. HE says he has gone to several dental providers and they do not take his insurance plan so he is asking for assistance in finding a provider. He also states he does not have a car so the dental provider needs to be in close proximity to his residence.  Advised Scott Burgess that this RNCM will send secure e-mail to his Scott Burgess requesting she contact him. If he has no dental coverage through his Scott Burgess,  will make referral to a Scott Burgess social worker to assist with dental care community resources.  Educated member on other Psychologist, forensic.  Member declined health coach for hypertension disease Burgess. No further intervention needed. Verified home address; will send successful outreach letter with [place] Burg[facility] program brochure and 24 hr nurse line magnet.  Scott Ellison RN,CCM,CDE [facility] Coordinator Office Phone [phone] Office Fax [phone]

## 2017-08-30 ENCOUNTER — Telehealth: Payer: Self-pay | Admitting: Internal Medicine

## 2017-08-30 NOTE — Telephone Encounter (Signed)
Needs refill on zolpidem (AMBIEN) 5 MG tablet @ [facility], pt contact#  [phone]

## 2017-09-03 MED ORDER — ZOLPIDEM TARTRATE 5 MG PO TABS: ORAL_TABLET | ORAL | 5 refills | Status: DC

## 2017-09-03 MED FILL — ZOLPIDEM TARTRATE 5 MG TAB: 5 | 30 days supply | Qty: 30 | Fill #0

## 2017-09-03 NOTE — Telephone Encounter (Signed)
Needs PCP Sept appt.. [facility]

## 2017-09-18 MED FILL — AMLODIPINE BESYLATE 5 MG TA: 5 | 90 days supply | Qty: 90 | Fill #1

## 2017-10-02 ENCOUNTER — Encounter: Payer: Self-pay | Admitting: Internal Medicine

## 2017-10-02 MED FILL — ZOLPIDEM TARTRATE 5 MG TAB: 5 | 30 days supply | Qty: 30 | Fill #1

## 2017-10-02 NOTE — Progress Notes (Signed)
  Subjective:  HPI: Scott Burgess is a 67 y.o. male who presents for chronic hepatitis C and hypertension   Please see Assessment and Plan below for the status of his chronic medical problems.  Review of Systems: Review of Systems  Constitutional: Negative for malaise/fatigue.  Respiratory: Negative for cough.   Cardiovascular: Negative for chest pain and leg swelling.  Psychiatric/Behavioral: The patient has insomnia.     Objective:  Physical Exam: Vitals:   10/03/17 0820 10/03/17 0853  BP: (!) 159/74 138/82  Pulse: 76   Temp: 97.9 F (36.6 C)   SpO2: 97%   Weight: 229 lb 9.6 oz (104.1 kg)   Height: 5\' 10"  (1.778 m)    Physical Exam  Constitutional: He appears well-developed and well-nourished.  Eyes: Conjunctivae are normal.  Cardiovascular: Normal rate, regular rhythm and normal heart sounds.  Pulmonary/Chest: Effort normal and breath sounds normal.  Musculoskeletal: He exhibits no edema.  Nursing note and vitals reviewed.  Assessment & Plan:  Essential hypertension HPI: No complaints, reports adherence to amlodipine.  A: Essential HTN, well controlled  P: Continue amlodipine 5mg  daily  History of hepatitis C He has had a sustained viral response, very appreciative of his care and assistance from [employer].  We discussed recommendation of repeating U/S q6 months, he wants to have it a little longer interval due to cost.  Insomnia HPI: Doing well, taking ambien PRN. Has maximized sleep hygine as able.  A: Insomnia  P: Continue prn Ambien 5mg  QHS   Medications Ordered Meds ordered this encounter  Medications  . pravastatin (PRAVACHOL) 10 MG tablet    Sig: Take 1 tablet (10 mg total) by mouth daily. Place on file    Dispense:  90 tablet    Refill:  3   Other Orders Orders Placed This Encounter  Procedures  . Flu Vaccine QUAD 36+ mos IM   Follow Up: Return 3-6 months for medicare annual wellness.

## 2017-10-03 ENCOUNTER — Encounter: Payer: Self-pay | Admitting: Internal Medicine

## 2017-10-03 ENCOUNTER — Other Ambulatory Visit: Payer: Self-pay

## 2017-10-03 ENCOUNTER — Ambulatory Visit (INDEPENDENT_AMBULATORY_CARE_PROVIDER_SITE_OTHER): Payer: PPO | Admitting: Internal Medicine

## 2017-10-03 VITALS — BP 138/82 | HR 76 | Temp 97.9°F | Ht 70.0 in | Wt 229.6 lb

## 2017-10-03 DIAGNOSIS — G4700 Insomnia, unspecified: Secondary | ICD-10-CM | POA: Diagnosis not present

## 2017-10-03 DIAGNOSIS — Z8619 Personal history of other infectious and parasitic diseases: Secondary | ICD-10-CM

## 2017-10-03 DIAGNOSIS — Z79899 Other long term (current) drug therapy: Secondary | ICD-10-CM | POA: Diagnosis not present

## 2017-10-03 DIAGNOSIS — I10 Essential (primary) hypertension: Secondary | ICD-10-CM | POA: Diagnosis not present

## 2017-10-03 DIAGNOSIS — Z23 Encounter for immunization: Secondary | ICD-10-CM

## 2017-10-03 MED ORDER — PRAVASTATIN SODIUM 10 MG PO TABS: 10.0000 mg | ORAL_TABLET | Freq: Every day | ORAL | 3 refills | Status: DC

## 2017-10-06 NOTE — Assessment & Plan Note (Signed)
HPI: Doing well, taking ambien PRN. Has maximized sleep hygine as able.  A: Insomnia  P: Continue prn Ambien 5mg  QHS

## 2017-10-06 NOTE — Assessment & Plan Note (Signed)
He has had a sustained viral response, very appreciative of his care and assistance from [employer].  We discussed recommendation of repeating U/S q6 months, he wants to have it a little longer interval due to cost.

## 2017-10-06 NOTE — Assessment & Plan Note (Signed)
HPI: No complaints, reports adherence to amlodipine.  A: Essential HTN, well controlled  P: Continue amlodipine 5mg  daily

## 2017-10-31 MED FILL — ZOLPIDEM TARTRATE 5 MG TAB: 5 | 30 days supply | Qty: 30 | Fill #2

## 2017-12-02 MED FILL — ZOLPIDEM TARTRATE 5 MG TAB: 5 | 30 days supply | Qty: 30 | Fill #3

## 2017-12-31 MED FILL — ZOLPIDEM TARTRATE 5 MG TAB: 5 | 30 days supply | Qty: 30 | Fill #4

## 2017-12-31 MED FILL — AMLODIPINE BESYLATE 5 MG TA: 5 | 90 days supply | Qty: 90 | Fill #2

## 2018-02-03 MED FILL — ZOLPIDEM TARTRATE 5 MG TAB: 5 | 30 days supply | Qty: 30 | Fill #5

## 2018-02-20 DIAGNOSIS — H40033 Anatomical narrow angle, bilateral: Secondary | ICD-10-CM | POA: Diagnosis not present

## 2018-02-20 DIAGNOSIS — H2513 Age-related nuclear cataract, bilateral: Secondary | ICD-10-CM | POA: Diagnosis not present

## 2018-03-04 ENCOUNTER — Other Ambulatory Visit: Payer: Self-pay | Admitting: Internal Medicine

## 2018-03-04 MED FILL — ZOLPIDEM TARTRATE 5 MG TAB: 5 | 30 days supply | Qty: 30 | Fill #0

## 2018-03-10 ENCOUNTER — Encounter: Payer: Self-pay | Admitting: *Deleted

## 2018-03-20 ENCOUNTER — Encounter: Payer: PPO | Admitting: Internal Medicine

## 2018-03-27 ENCOUNTER — Encounter (INDEPENDENT_AMBULATORY_CARE_PROVIDER_SITE_OTHER): Payer: Self-pay

## 2018-03-27 ENCOUNTER — Ambulatory Visit (INDEPENDENT_AMBULATORY_CARE_PROVIDER_SITE_OTHER): Payer: PPO | Admitting: Internal Medicine

## 2018-03-27 ENCOUNTER — Encounter: Payer: Self-pay | Admitting: Internal Medicine

## 2018-03-27 ENCOUNTER — Other Ambulatory Visit: Payer: Self-pay

## 2018-03-27 VITALS — BP 173/90 | HR 91 | Temp 97.8°F | Ht 70.0 in | Wt 232.7 lb

## 2018-03-27 DIAGNOSIS — F5101 Primary insomnia: Secondary | ICD-10-CM

## 2018-03-27 DIAGNOSIS — M545 Low back pain, unspecified: Secondary | ICD-10-CM

## 2018-03-27 DIAGNOSIS — M170 Bilateral primary osteoarthritis of knee: Secondary | ICD-10-CM | POA: Diagnosis not present

## 2018-03-27 DIAGNOSIS — I10 Essential (primary) hypertension: Secondary | ICD-10-CM | POA: Diagnosis not present

## 2018-03-27 DIAGNOSIS — Z791 Long term (current) use of non-steroidal anti-inflammatories (NSAID): Secondary | ICD-10-CM | POA: Diagnosis not present

## 2018-03-27 DIAGNOSIS — G8929 Other chronic pain: Secondary | ICD-10-CM

## 2018-03-27 DIAGNOSIS — Z79899 Other long term (current) drug therapy: Secondary | ICD-10-CM

## 2018-03-27 MED ORDER — NAPROXEN 500 MG PO TABS: 500.0000 mg | ORAL_TABLET | Freq: Two times a day (BID) | ORAL | 2 refills | Status: DC

## 2018-03-27 MED ORDER — HYDROCHLOROTHIAZIDE 25 MG PO TABS: 25.0000 mg | ORAL_TABLET | Freq: Every day | ORAL | 3 refills | Status: DC

## 2018-03-27 MED FILL — HYDROCHLOROTHIAZIDE 25 MG T: 25 | 90 days supply | Qty: 90 | Fill #0

## 2018-03-27 MED FILL — NAPROXEN 500 MG TABLET: 500 | 30 days supply | Qty: 60 | Fill #0 | Status: TO

## 2018-03-28 DIAGNOSIS — M545 Low back pain, unspecified: Secondary | ICD-10-CM | POA: Insufficient documentation

## 2018-03-28 NOTE — Assessment & Plan Note (Signed)
HPI: He reports that his right as well as his left knee has been hurting more recently.  He has been taking ibuprofen 400 mg 2-3 times a day which does help some.  He reports that are steroid injection to his right knee helped greatly.  Pain is usually more on the medial aspect of both knees.  And worse at the end of the day less than 10 minutes of morning joint stiffness.  Assessment osteoarthritis of bilateral knees  Plan Patient plans to start using knee sleeve/brace Discussed discontinuing over-the-counter aspirin and ibuprofen and start trial of naproxen 500 mg twice daily Patient will let me know if he needs subsequent steroid injection as this was greatly beneficial in the past.

## 2018-03-28 NOTE — Assessment & Plan Note (Signed)
HPI: Patient complaining of some low back pain pain has been present for the last several weeks.  However is been a little worse in the last 1 to 2 weeks.  He thinks it is associated with lifting too much at work.  Is been taken some over-the-counter ibuprofen which helps some.  Assessment subacute low back pain  Plan  We will start naproxen 500 mg twice daily Patient will let me know if this is not working I will place a referral to physical therapy.

## 2018-03-28 NOTE — Assessment & Plan Note (Signed)
HPI: He reports his insomnia is been a little worse lately he has been taking the Ambien 5 mg but he does not think it is fully effective he feels like he did better on 10 mg.  Assessment primary insomnia  Plan We discussed that Ambien 10 mg is a little bit higher risk now that he is above 65.  Our overall plan will be to continue Ambien 5 mg daily and he will try to add over-the-counter melatonin.  We may have to readdress risk versus benefits of increasing Ambien dose or trying alternatives.

## 2018-03-28 NOTE — Assessment & Plan Note (Signed)
HPI: No complaints has been taking his amlodipine 5 mg daily without issue.  Assessment essential hypertension above goal  Plan Continue amlodipine 5 mg daily we will add hydrochlorothiazide 25 mg daily.

## 2018-03-28 NOTE — Progress Notes (Signed)
Subjective:  HPI: [name] is a 68 y.o. male who presents for hypertension   Please see Assessment and Plan below for the status of his chronic medical problems.  Review of Systems: Review of Systems  Constitutional: Negative for malaise/fatigue.  Respiratory: Negative for cough.   Cardiovascular: Negative for chest pain and leg swelling.  Musculoskeletal: Positive for back pain and joint pain. Negative for falls.  Psychiatric/Behavioral: The patient has insomnia.     Objective:  Physical Exam: Vitals:   03/27/18 1141  BP: (!) 173/90  Pulse: 91  Temp: 97.8 F (36.6 C)  TempSrc: Oral  SpO2: 96%  Weight: 232 lb 11.2 oz (105.6 kg)  Height: 5\' 10"  (1.778 m)   Physical Exam Vitals signs and nursing note reviewed.  Constitutional:      Appearance: He is well-developed.  Eyes:     Conjunctiva/sclera: Conjunctivae normal.  Cardiovascular:     Rate and Rhythm: Normal rate and regular rhythm.     Heart sounds: Normal heart sounds.  Pulmonary:     Effort: Pulmonary effort is normal.     Breath sounds: Normal breath sounds.  Musculoskeletal:     Right knee: He exhibits no effusion. Tenderness found. Medial joint line tenderness noted. No lateral joint line tenderness noted.     Left knee: He exhibits no effusion. Tenderness found. Medial joint line tenderness noted. No lateral joint line tenderness noted.     Lumbar back: He exhibits tenderness. He exhibits no bony tenderness, no swelling and no edema.  Skin:    Capillary Refill: Capillary refill takes less than 2 seconds.    Assessment & Plan:  Essential hypertension HPI: No complaints has been taking his amlodipine 5 mg daily without issue.  Assessment essential hypertension above goal  Plan Continue amlodipine 5 mg daily we will add hydrochlorothiazide 25 mg daily.  Insomnia HPI: He reports his insomnia is been a little worse lately he has been taking the Ambien 5 mg but he does not think it is fully  effective he feels like he did better on 10 mg.  Assessment primary insomnia  Plan We discussed that Ambien 10 mg is a little bit higher risk now that he is above 65.  Our overall plan will be to continue Ambien 5 mg daily and he will try to add over-the-counter melatonin.  We may have to readdress risk versus benefits of increasing Ambien dose or trying alternatives.  Osteoarthritis of knees, bilateral HPI: He reports that his right as well as his left knee has been hurting more recently.  He has been taking ibuprofen 400 mg 2-3 times a day which does help some.  He reports that are steroid injection to his right knee helped greatly.  Pain is usually more on the medial aspect of both knees.  And worse at the end of the day less than 10 minutes of morning joint stiffness.  Assessment osteoarthritis of bilateral knees  Plan Patient plans to start using knee sleeve/brace Discussed discontinuing over-the-counter aspirin and ibuprofen and start trial of naproxen 500 mg twice daily Patient will let me know if he needs subsequent steroid injection as this was greatly beneficial in the past.  Low back pain HPI: Patient complaining of some low back pain pain has been present for the last several weeks.  However is been a little worse in the last 1 to 2 weeks.  He thinks it is associated with lifting too much at work.  Is been taken some over-the-counter ibuprofen  which helps some.  Assessment subacute low back pain  Plan  We will start naproxen 500 mg twice daily Patient will let me know if this is not working I will place a referral to physical therapy.    Medications Ordered Meds ordered this encounter  Medications  . naproxen (NAPROSYN) 500 MG tablet    Sig: Take 1 tablet (500 mg total) by mouth 2 (two) times daily with a meal.    Dispense:  60 tablet    Refill:  2  . hydrochlorothiazide (HYDRODIURIL) 25 MG tablet    Sig: Take 1 tablet (25 mg total) by mouth daily.    Dispense:  90  tablet    Refill:  3   Other Orders No orders of the defined types were placed in this encounter.  Follow Up: Return 4-5 months with dr Heber Kila.

## 2018-04-02 MED FILL — ZOLPIDEM TARTRATE 5 MG TAB: 5 | 30 days supply | Qty: 30 | Fill #1 | Status: TO

## 2018-05-05 MED FILL — ZOLPIDEM TARTRATE 5 MG TAB: 5 | 30 days supply | Qty: 30 | Fill #0

## 2018-05-16 MED FILL — NAPROXEN 500 MG TABLET: 500 | 30 days supply | Qty: 60 | Fill #0

## 2018-05-22 ENCOUNTER — Encounter: Payer: PPO | Admitting: Internal Medicine

## 2018-06-03 MED FILL — ZOLPIDEM TARTRATE 5 MG TAB: 5 | 30 days supply | Qty: 30 | Fill #1

## 2018-06-28 DIAGNOSIS — L03116 Cellulitis of left lower limb: Secondary | ICD-10-CM | POA: Diagnosis not present

## 2018-07-02 MED FILL — ZOLPIDEM TARTRATE 5 MG TAB: 5 | 30 days supply | Qty: 30 | Fill #2

## 2018-07-02 MED FILL — HYDROCHLOROTHIAZIDE 25 MG T: 25 | 90 days supply | Qty: 90 | Fill #0

## 2018-07-02 MED FILL — NAPROXEN 500 MG TABLET: 500 | 30 days supply | Qty: 60 | Fill #1

## 2018-07-03 ENCOUNTER — Other Ambulatory Visit: Payer: Self-pay | Admitting: *Deleted

## 2018-07-03 MED ORDER — AMLODIPINE BESYLATE 5 MG PO TABS: 5.0000 mg | ORAL_TABLET | Freq: Every day | ORAL | 3 refills | Status: DC

## 2018-07-03 MED FILL — AMLODIPINE BESYLATE 5 MG TA: 5 | 90 days supply | Qty: 90 | Fill #0

## 2018-07-30 ENCOUNTER — Other Ambulatory Visit: Payer: Self-pay | Admitting: *Deleted

## 2018-07-30 MED FILL — ZOLPIDEM TARTRATE 5 MG TAB: 5 | 30 days supply | Qty: 30 | Fill #3

## 2018-08-04 ENCOUNTER — Other Ambulatory Visit: Payer: Self-pay | Admitting: *Deleted

## 2018-08-05 MED ORDER — NAPROXEN 500 MG PO TABS: 500.0000 mg | ORAL_TABLET | Freq: Two times a day (BID) | ORAL | 2 refills | Status: AC

## 2018-08-05 MED FILL — NAPROXEN 500 MG TABLET: 500 | 30 days supply | Qty: 60 | Fill #0

## 2018-08-05 NOTE — Telephone Encounter (Signed)
Spoke with the pt.  HE has sch an appt for 08/14/2018 with his PCP.

## 2018-08-05 NOTE — Telephone Encounter (Signed)
A user error has taken place: encounter opened in error, closed for administrative reasons.

## 2018-08-14 ENCOUNTER — Other Ambulatory Visit: Payer: Self-pay

## 2018-08-14 ENCOUNTER — Encounter: Payer: Self-pay | Admitting: Internal Medicine

## 2018-08-14 ENCOUNTER — Ambulatory Visit (INDEPENDENT_AMBULATORY_CARE_PROVIDER_SITE_OTHER): Payer: PPO | Admitting: Internal Medicine

## 2018-08-14 VITALS — BP 155/89 | HR 78 | Temp 98.2°F | Ht 70.0 in | Wt 230.0 lb

## 2018-08-14 DIAGNOSIS — Z23 Encounter for immunization: Secondary | ICD-10-CM

## 2018-08-14 DIAGNOSIS — S81802D Unspecified open wound, left lower leg, subsequent encounter: Secondary | ICD-10-CM

## 2018-08-14 DIAGNOSIS — G4700 Insomnia, unspecified: Secondary | ICD-10-CM

## 2018-08-14 DIAGNOSIS — F5101 Primary insomnia: Secondary | ICD-10-CM

## 2018-08-14 DIAGNOSIS — M170 Bilateral primary osteoarthritis of knee: Secondary | ICD-10-CM

## 2018-08-14 DIAGNOSIS — X58XXXD Exposure to other specified factors, subsequent encounter: Secondary | ICD-10-CM

## 2018-08-14 DIAGNOSIS — Z20828 Contact with and (suspected) exposure to other viral communicable diseases: Secondary | ICD-10-CM | POA: Diagnosis not present

## 2018-08-14 DIAGNOSIS — Z20822 Contact with and (suspected) exposure to covid-19: Secondary | ICD-10-CM | POA: Insufficient documentation

## 2018-08-14 DIAGNOSIS — Z79899 Other long term (current) drug therapy: Secondary | ICD-10-CM | POA: Diagnosis not present

## 2018-08-14 DIAGNOSIS — S81802A Unspecified open wound, left lower leg, initial encounter: Secondary | ICD-10-CM | POA: Insufficient documentation

## 2018-08-14 DIAGNOSIS — E559 Vitamin D deficiency, unspecified: Secondary | ICD-10-CM | POA: Diagnosis not present

## 2018-08-14 DIAGNOSIS — I10 Essential (primary) hypertension: Secondary | ICD-10-CM | POA: Diagnosis not present

## 2018-08-14 DIAGNOSIS — Z8619 Personal history of other infectious and parasitic diseases: Secondary | ICD-10-CM

## 2018-08-14 DIAGNOSIS — Z9189 Other specified personal risk factors, not elsewhere classified: Secondary | ICD-10-CM

## 2018-08-14 NOTE — Progress Notes (Signed)
  Subjective:  HPI: Scott Burgess is a 68 y.o. male who presents for f/u HTN, left leg wound  Please see Assessment and Plan below for the status of his chronic medical problems.  Review of Systems: Review of Systems  Constitutional: Negative for chills and fever.  Respiratory: Negative for cough and shortness of breath.   Cardiovascular: Negative for chest pain and claudication.  Gastrointestinal: Negative for abdominal pain and vomiting.  Musculoskeletal: Positive for joint pain (mild knee pain). Negative for myalgias.  Neurological: Negative for dizziness.    Objective:  Physical Exam: Vitals:   08/14/18 0821  BP: (!) 155/89  Pulse: 78  Temp: 98.2 F (36.8 C)  TempSrc: Oral  SpO2: 97%  Weight: 230 lb (104.3 kg)  Height: 5\' 10"  (1.778 m)   Body mass index is 33 kg/m. Physical Exam Cardiovascular:     Rate and Rhythm: Normal rate and regular rhythm.     Pulses:          Popliteal pulses are 2+ on the left side.       Dorsalis pedis pulses are 1+ on the left side.     Heart sounds: No murmur.  Pulmonary:     Effort: Pulmonary effort is normal.     Breath sounds: Normal breath sounds.  Musculoskeletal:     Right knee: He exhibits no effusion. Tenderness found. Medial joint line and lateral joint line tenderness noted.     Left knee: He exhibits no effusion. Tenderness found. Medial joint line and lateral joint line tenderness noted.     Right lower leg: No edema.     Left lower leg: No edema.  Skin:         Left posterior leg  Assessment & Plan:  See Encounters Tab for problem based charting.  Medications Ordered No orders of the defined types were placed in this encounter.  Other Orders Orders Placed This Encounter  Procedures  . SAR CoV2 Serology (COVID 19)AB(IGG)IA  . CMP14 + Anion Gap  . Lipid Profile  . Vitamin D (25 hydroxy)  . POCT ABI Screening Pilot No Charge    This Order is for screening for the ABI Pilot.  It is a no charge exam.     Follow Up: Return 2 weeks in [facility] for BP and wound recheck, 4-6 weeks with Dr Heber Dysart.

## 2018-08-14 NOTE — Assessment & Plan Note (Addendum)
HPI: He reports he had a wound on the back of his left leg that started in June. He with to his grandson's graduation and his daughter saw it and had him seen at [facility] in [place], [place]. He reports it was infected, they did some sort of swab and rx 10 days of bactrim. He notes that it improved after that, he has continued with petrolem and gauze to the area but it has not healed completely. I have taken a picture of the wound (stored in media * note incorrectly labeled at right leg)  A: Wound of left leg 5cmx 3.5cm  P: No signs of superficial infection at this time. Borders look good, did have decreased DP pulse, will obtain ABI in office.  >>> Our abi machine detected weak pulse and did not provide a reading (although on the right side), I rechecked with doppler, DP and PT pulses are present bilaterally, weaker on left.  Will obtain Vasc ABI Obtain Records from Urgent Care.  We applied silicone gel adhesive composite hydrocellular foam dressing he will keep in place for 1 week then change (change provided) and follow up in 2 weeks.

## 2018-08-14 NOTE — Assessment & Plan Note (Signed)
Taking 1000 IU of D3 daily  Recheck Vitamin D level

## 2018-08-14 NOTE — Assessment & Plan Note (Signed)
HE has fallen out of taking pravastatin 10mg  daily, he will resume taking this.

## 2018-08-14 NOTE — Addendum Note (Signed)
Addended by: Ebbie Latus on: 08/14/2018 10:15 AM   Modules accepted: Orders

## 2018-08-14 NOTE — Assessment & Plan Note (Addendum)
HPI: doing well after decrease to 5mg  of ambien PRN qhs  A: INSOMNIA  P Continue ambien 5mg  QHS PRN

## 2018-08-14 NOTE — Assessment & Plan Note (Signed)
HPI: Much improved with naproxen however has continued to take 1-2 pills a day.    A: OA of bilateral knees  P: Decrease frequency of naproxen use due to elevated BP.
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
## 2018-08-14 NOTE — Progress Notes (Signed)
ROI form signed and faxed to [facility] in [place] ,[place] for records.

## 2018-08-14 NOTE — Assessment & Plan Note (Signed)
HPI: No complaints, brings medications taking as directed, more urination after starting HCTZ  A: Essential HTN not at goal  P: Discussed possibly starting third agent, however we will instead, decrease soda/salt intake, decrease frequency of naproxen use (only as needed infrequently) he will follow up in 2 weeks in our [facility] clinic. If not to at least <140/90 I would like to start him on lisinopril 10mg  daily at that time.  I will follow up with him in 4-6 weeks. Continue Amlodipine 5mg  daily Continue HCTZ 25mg  daily

## 2018-08-16 LAB — CMP14 + ANION GAP
ALT: 25 IU/L (ref 0–44)
AST: 30 IU/L (ref 0–40)
Albumin/Globulin Ratio: 1.7 (ref 1.2–2.2)
Albumin: 5 g/dL — ABNORMAL HIGH (ref 3.8–4.8)
Alkaline Phosphatase: 66 IU/L (ref 39–117)
Anion Gap: 22 mmol/L — ABNORMAL HIGH (ref 10.0–18.0)
BUN/Creatinine Ratio: 18 (ref 10–24)
BUN: 18 mg/dL (ref 8–27)
Bilirubin Total: 0.5 mg/dL (ref 0.0–1.2)
CO2: 18 mmol/L — ABNORMAL LOW (ref 20–29)
Calcium: 9.9 mg/dL (ref 8.6–10.2)
Chloride: 100 mmol/L (ref 96–106)
Creatinine, Ser: 1.02 mg/dL (ref 0.76–1.27)
GFR calc Af Amer: 88 mL/min/{1.73_m2} (ref >59)
GFR calc non Af Amer: 76 mL/min/{1.73_m2} (ref >59)
Globulin, Total: 3 g/dL (ref 1.5–4.5)
Glucose: 106 mg/dL — ABNORMAL HIGH (ref 65–99)
Potassium: 3.7 mmol/L (ref 3.5–5.2)
Sodium: 140 mmol/L (ref 134–144)
Total Protein: 8 g/dL (ref 6.0–8.5)

## 2018-08-16 LAB — LIPID PANEL
Chol/HDL Ratio: 5.7 ratio — ABNORMAL HIGH (ref 0.0–5.0)
Cholesterol, Total: 189 mg/dL (ref 100–199)
HDL: 33 mg/dL — ABNORMAL LOW (ref >39)
LDL Calculated: 139 mg/dL — ABNORMAL HIGH (ref 0–99)
Triglycerides: 86 mg/dL (ref 0–149)
VLDL Cholesterol Cal: 17 mg/dL (ref 5–40)

## 2018-08-16 LAB — VITAMIN D 25 HYDROXY (VIT D DEFICIENCY, FRACTURES): Vit D, 25-Hydroxy: 27.7 ng/mL — ABNORMAL LOW (ref 30.0–100.0)

## 2018-08-16 LAB — SAR COV2 SEROLOGY (COVID19)AB(IGG),IA: SARS-CoV-2 Ab, IgG: NEGATIVE

## 2018-08-19 ENCOUNTER — Other Ambulatory Visit: Payer: Self-pay

## 2018-08-19 ENCOUNTER — Telehealth: Payer: Self-pay | Admitting: Internal Medicine

## 2018-08-19 ENCOUNTER — Ambulatory Visit (HOSPITAL_COMMUNITY)
Admission: RE | Admit: 2018-08-19 | Discharge: 2018-08-19 | Disposition: A | Discharge status: Home / Self Care | Payer: PPO | Source: Ambulatory Visit | Attending: Internal Medicine | Admitting: Internal Medicine

## 2018-08-19 DIAGNOSIS — S81802D Unspecified open wound, left lower leg, subsequent encounter: Secondary | ICD-10-CM | POA: Diagnosis not present

## 2018-08-19 DIAGNOSIS — I739 Peripheral vascular disease, unspecified: Secondary | ICD-10-CM | POA: Insufficient documentation

## 2018-08-19 MED ORDER — ASPIRIN EC 81 MG PO TBEC: 81.0000 mg | DELAYED_RELEASE_TABLET | Freq: Every day | ORAL | 2 refills | Status: AC

## 2018-08-19 MED ORDER — ATORVASTATIN CALCIUM 40 MG PO TABS: 40.0000 mg | ORAL_TABLET | Freq: Every day | ORAL | 3 refills | Status: DC

## 2018-08-19 NOTE — Telephone Encounter (Signed)
Called patient re ABI results>> show PAD bilateral lower extrimities.  I will have him restart 81mg  ASA daily, and start Atrovastatin 40mg  daily (d/c Pravastatin), I discussed starting a walking program at home.  I discussed possible referral to vascular surgery given wound on left leg.  He feels it is healing up at this time. He will keep appointment with out clinic on the 13th and if not healing as expected we will place a referral to vascular surgery at that point.

## 2018-08-19 NOTE — Progress Notes (Signed)
Bilateral ABIs and TBIs completed. Preliminary results in Chart review CV Proc. [place] Meara Wiechman,RVS 08/19/2018, 9:55 AM

## 2018-08-19 NOTE — Assessment & Plan Note (Signed)
Called patient re ABI results>> show PAD bilateral lower extrimities.  I will have him restart 81mg  ASA daily, and start Atrovastatin 40mg  daily (d/c Pravastatin), I discussed starting a walking program at home.  I discussed possible referral to vascular surgery given wound on left leg.  He feels it is healing up at this time. He will keep appointment with out clinic on the 13th and if not healing as expected we will place a referral to vascular surgery at that point.

## 2018-08-20 MED FILL — ATORVASTATIN 40 MG TABLET: 40 | 90 days supply | Qty: 90 | Fill #0

## 2018-08-21 ENCOUNTER — Encounter (HOSPITAL_COMMUNITY): Payer: PPO

## 2018-08-28 ENCOUNTER — Ambulatory Visit (INDEPENDENT_AMBULATORY_CARE_PROVIDER_SITE_OTHER): Payer: PPO | Admitting: Internal Medicine

## 2018-08-28 ENCOUNTER — Other Ambulatory Visit: Payer: Self-pay

## 2018-08-28 ENCOUNTER — Encounter: Payer: Self-pay | Admitting: Internal Medicine

## 2018-08-28 VITALS — BP 134/73 | HR 88 | Temp 98.3°F | Ht 70.0 in | Wt 223.0 lb

## 2018-08-28 DIAGNOSIS — I10 Essential (primary) hypertension: Secondary | ICD-10-CM

## 2018-08-28 DIAGNOSIS — Z79899 Other long term (current) drug therapy: Secondary | ICD-10-CM

## 2018-08-28 DIAGNOSIS — F5101 Primary insomnia: Secondary | ICD-10-CM

## 2018-08-28 MED ORDER — ZOLPIDEM TARTRATE 5 MG PO TABS: ORAL_TABLET | ORAL | 5 refills | Status: DC

## 2018-08-28 MED FILL — ZOLPIDEM TARTRATE 5 MG TAB: 5 | 30 days supply | Qty: 30 | Fill #0
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
## 2018-08-28 NOTE — Patient Instructions (Signed)
Mr. Finkler,   It was a pleasure taking care of your the clinic today.  Overall, it looks like you are doing very well with your blood pressure and I would encourage you to continue doing exactly what you have been doing regarding eating well, cutting salt and also exercising.  Take care! Dr. Eileen Stanford  Please call the internal medicine center clinic if you have any questions or concerns, we may be able to help and keep you from a long and expensive emergency room wait. Our clinic and after hours phone number is [phone], the best time to call is Monday through Friday 9 am to 4 pm but there is always someone available 24/7 if you have an emergency. If you need medication refills please notify your pharmacy one week in advance and they will send [place] a request.

## 2018-08-28 NOTE — Progress Notes (Signed)
Internal Medicine Clinic Attending  Case discussed with Dr. Agyei at the time of the visit.  We reviewed the resident's history and exam and pertinent patient test results.  I agree with the assessment, diagnosis, and plan of care documented in the resident's note.  Alexander Raines, M.D., Ph.D.  

## 2018-08-28 NOTE — Progress Notes (Signed)
   CC: Follow-up hypertension  HPI:  Scott Burgess is a 68 y.o. very pleasant African-American gentleman with medical history listed below presenting to follow-up on hypertension.  Please see problem based charting for further details.  Past Medical History:  Diagnosis Date  . Hepatitis C, chronic (HCC)    genotype 2  . Hernia   . History of hepatitis C 01/22/2012   [place] w/ elastography shows F2/F3 Completed 8 weeks Mayvert on 04/29/17. 12 weeks sustained viral response was undetectable.  Will need ongoing screening for [place] recommended by [place] q 1 year, patient requests longer interval due to cost.    . Hyperlipidemia   . Hypertension   . Osteoarthritis of right knee 02/11/2014  . Personal history of colonic adenoma 03/17/2012  . Pneumonia   . STD (male)    Review of Systems: As per HPI  Physical Exam:  Vitals:   08/28/18 0840  BP: 134/73  Pulse: 88  Temp: 98.3 F (36.8 C)  TempSrc: Oral  SpO2: 98%  Weight: 223 lb (101.2 kg)  Height: 5\' 10"  (1.778 m)   Physical Exam  Constitutional: He is well-developed, well-nourished, and in no distress.  HENT:  Head: Normocephalic and atraumatic.  Cardiovascular: Normal rate, regular rhythm and normal heart sounds.  No murmur heard. Pulmonary/Chest: Effort normal and breath sounds normal. He has no wheezes.    Assessment & Plan:   See Encounters Tab for problem based charting.  Patient discussed with Dr. Rebeca Alert

## 2018-09-29 ENCOUNTER — Telehealth: Payer: Self-pay | Admitting: Internal Medicine

## 2018-09-29 MED FILL — ZOLPIDEM TARTRATE 5 MG TAB: 5 | 30 days supply | Qty: 30 | Fill #1

## 2018-09-29 NOTE — Telephone Encounter (Signed)
Could you please call pt, he is upset about his ambien costing $9.00, he states he will stop after this month, explained he probably needed to come in for appt because he may not need to stop suddenly. He doesn't quite understand, said he would start melatonin if he has problems. Refused appt. Ask him to call his insurance company but he doesn't want to do so. Thought maybe you can speak to him

## 2018-09-29 NOTE — Telephone Encounter (Signed)
Called him, he is upset about new cost of medications as $9, also that he got a $75 coinsurance payment from vascular u/s.  He feels that this is an incorrect charge.  I told him I am not aware of the details of his insurance coverage, it sounds like he previously had medicare extra help and meds cost ~30 cents.  I wonder if we can get this back for him.   Delsa Sale could you help? Otherwise could we get [place] services to help?

## 2018-09-29 NOTE — Telephone Encounter (Signed)
Pt requesting a call back about his zolpidem (AMBIEN) 5 MG tablet  [facility] - [place], [place] - [address].  Pt requesting a call back about discontinuing this medication.

## 2018-10-03 NOTE — Telephone Encounter (Signed)
Hi Dr. Heber [place], it looks like his Medicare Extra Help may have lapsed, as I had worked with him on it > 1 year ago. I can work with him again. Thank you.

## 2018-10-06 ENCOUNTER — Telehealth: Payer: Self-pay | Admitting: Pharmacist

## 2018-10-06 DIAGNOSIS — I159 Secondary hypertension, unspecified: Secondary | ICD-10-CM

## 2018-10-06 MED ORDER — AMLODIPINE-ATORVASTATIN 5-40 MG PO TABS: 1.0000 | ORAL_TABLET | Freq: Every day | ORAL | 3 refills | Status: DC

## 2018-10-06 NOTE — Telephone Encounter (Signed)
Applied for Medicare Extra Help and combined amlodipine-atorvastatin for cost savings.

## 2018-10-06 NOTE — Progress Notes (Signed)
Applied for Medicare Extra Help

## 2018-10-07 MED ORDER — AMLODIPINE BESYLATE 5 MG PO TABS: 5.0000 mg | ORAL_TABLET | Freq: Every day | ORAL | 3 refills | Status: DC

## 2018-10-07 MED ORDER — ATORVASTATIN CALCIUM 40 MG PO TABS: 40.0000 mg | ORAL_TABLET | Freq: Every day | ORAL | 3 refills | Status: DC

## 2018-10-07 MED FILL — AMLODIPINE BESYLATE 5 MG TA: 5 | 90 days supply | Qty: 90 | Fill #0

## 2018-10-07 NOTE — Addendum Note (Signed)
Addended by: Forde Dandy on: 10/07/2018 04:46 PM   Modules accepted: Orders

## 2018-10-27 MED FILL — HYDROCHLOROTHIAZIDE 25 MG T: 25 | 90 days supply | Qty: 90 | Fill #0

## 2018-11-19 MED FILL — ATORVASTATIN 40 MG TABLET: 40 | 90 days supply | Qty: 90 | Fill #0

## 2019-01-29 MED FILL — AMLODIPINE BESYLATE 5 MG TA: 5 | 90 days supply | Qty: 90 | Fill #0

## 2019-01-29 MED FILL — ZOLPIDEM TARTRATE 5 MG TAB: 5 | 30 days supply | Qty: 30 | Fill #2

## 2019-01-29 MED FILL — HYDROCHLOROTHIAZIDE 25 MG T: 25 | 90 days supply | Qty: 90 | Fill #1

## 2019-02-19 DIAGNOSIS — H40033 Anatomical narrow angle, bilateral: Secondary | ICD-10-CM | POA: Diagnosis not present

## 2019-02-19 DIAGNOSIS — H2513 Age-related nuclear cataract, bilateral: Secondary | ICD-10-CM | POA: Diagnosis not present

## 2019-02-27 ENCOUNTER — Other Ambulatory Visit: Payer: Self-pay | Admitting: Internal Medicine

## 2019-02-27 DIAGNOSIS — F5101 Primary insomnia: Secondary | ICD-10-CM

## 2019-03-02 ENCOUNTER — Telehealth: Payer: Self-pay | Admitting: Internal Medicine

## 2019-03-02 ENCOUNTER — Other Ambulatory Visit: Payer: Self-pay | Admitting: Internal Medicine

## 2019-03-02 DIAGNOSIS — F5101 Primary insomnia: Secondary | ICD-10-CM

## 2019-03-02 NOTE — Telephone Encounter (Signed)
Need refill on zolpidem (AMBIEN) 5 MG tablet  ;pt contact [place], [place] - [address] [facility].

## 2019-03-03 MED FILL — ZOLPIDEM TARTRATE 5 MG TAB: 5 | 30 days supply | Qty: 30 | Fill #0

## 2019-04-02 MED FILL — ZOLPIDEM TARTRATE 5 MG TAB: 5 | 30 days supply | Qty: 30 | Fill #1

## 2019-04-16 ENCOUNTER — Ambulatory Visit (INDEPENDENT_AMBULATORY_CARE_PROVIDER_SITE_OTHER): Payer: PPO | Admitting: Internal Medicine

## 2019-04-16 ENCOUNTER — Other Ambulatory Visit: Payer: Self-pay

## 2019-04-16 ENCOUNTER — Encounter: Payer: Self-pay | Admitting: Internal Medicine

## 2019-04-16 VITALS — BP 147/80 | HR 81 | Temp 98.0°F | Ht 70.0 in | Wt 227.8 lb

## 2019-04-16 DIAGNOSIS — I10 Essential (primary) hypertension: Secondary | ICD-10-CM | POA: Diagnosis not present

## 2019-04-16 DIAGNOSIS — Z7982 Long term (current) use of aspirin: Secondary | ICD-10-CM

## 2019-04-16 DIAGNOSIS — Z79899 Other long term (current) drug therapy: Secondary | ICD-10-CM

## 2019-04-16 DIAGNOSIS — M170 Bilateral primary osteoarthritis of knee: Secondary | ICD-10-CM | POA: Diagnosis not present

## 2019-04-16 DIAGNOSIS — Z791 Long term (current) use of non-steroidal anti-inflammatories (NSAID): Secondary | ICD-10-CM | POA: Diagnosis not present

## 2019-04-16 DIAGNOSIS — I739 Peripheral vascular disease, unspecified: Secondary | ICD-10-CM

## 2019-04-16 DIAGNOSIS — F5101 Primary insomnia: Secondary | ICD-10-CM | POA: Diagnosis not present

## 2019-04-16 NOTE — Assessment & Plan Note (Signed)
HPI: Continues to have occasional knee pain this helps when he wears knee braces.  Naproxen has been helping occasionally.  Assessment osteoarthritis of bilateral knees  Plan Continue current regimen

## 2019-04-16 NOTE — Assessment & Plan Note (Signed)
HPI: At our last visit he had a left leg wound work-up during that time included ABIs which revealed moderate reduction in left and mild right.  He has now been taking 81 mg of aspirin and atorvastatin every day.  His wound is well-healed.  He has not had a routine exercise regimen.  Assessment peripheral arterial disease  Plan Continue 81 mg of aspirin and 40 mg of atorvastatin Recheck lipid panel Encouraged to start a walking regimen

## 2019-04-16 NOTE — Assessment & Plan Note (Signed)
HPI: Overall no issues does reasonably well with Ambien 5 mg nightly.  Assessment primary insomnia  Plan Continue Ambien 5 mg nightly

## 2019-04-16 NOTE — Progress Notes (Signed)
  Subjective:  HPI: [name] is a 69 y.o. male who presents for f/u HTN  Please see Assessment and Plan below for the status of his chronic medical problems.  Review of Systems: Review of Systems  Constitutional: Negative for fever.  HENT: Negative for hearing loss.   Respiratory: Negative for cough.   Cardiovascular: Negative for chest pain.  Musculoskeletal: Negative for myalgias.  Endo/Heme/Allergies: Negative for polydipsia.    Objective:  Physical Exam: Vitals:   04/16/19 0842  BP: (!) 147/80  Pulse: 81  Temp: 98 F (36.7 C)  TempSrc: Oral  SpO2: 96%  Weight: 227 lb 12.8 oz (103.3 kg)  Height: 5\' 10"  (1.778 m)   Body mass index is 32.69 kg/m. Physical Exam Vitals and nursing note reviewed.  Constitutional:      Appearance: Normal appearance.  Cardiovascular:     Rate and Rhythm: Normal rate and regular rhythm.     Heart sounds: Normal heart sounds.  Pulmonary:     Effort: Pulmonary effort is normal.     Breath sounds: Normal breath sounds.  Skin:    Comments: Wound left lateral lower leg well healed.  Neurological:     Mental Status: He is alert.  Psychiatric:        Mood and Affect: Mood normal.        Behavior: Behavior normal.    Assessment & Plan:  See Encounters Tab for problem based charting.  Medications Ordered No orders of the defined types were placed in this encounter.  Other Orders Orders Placed This Encounter  Procedures  . Lipid Profile  . CMP14 + Anion Gap  . CBC with Diff   Follow Up: Return in about 6 months (around 10/16/2019).

## 2019-04-16 NOTE — Assessment & Plan Note (Signed)
HPI: Has been taking his antihypertensive issue overall blood pressure has been well controlled he checks it at home.  And has been well controlled on previous visits.  Assessment essential hypertension grossly well controlled but slightly elevated today  Plan Continue amlodipine 5 mg daily along with hydrochlorothiazide 25 mg daily.

## 2019-04-17 LAB — CMP14 + ANION GAP
ALT: 31 IU/L (ref 0–44)
AST: 34 IU/L (ref 0–40)
Albumin/Globulin Ratio: 1.8 (ref 1.2–2.2)
Albumin: 4.9 g/dL — ABNORMAL HIGH (ref 3.8–4.8)
Alkaline Phosphatase: 63 IU/L (ref 39–117)
Anion Gap: 13 mmol/L (ref 10.0–18.0)
BUN/Creatinine Ratio: 17 (ref 10–24)
BUN: 18 mg/dL (ref 8–27)
Bilirubin Total: 0.6 mg/dL (ref 0.0–1.2)
CO2: 25 mmol/L (ref 20–29)
Calcium: 9.7 mg/dL (ref 8.6–10.2)
Chloride: 100 mmol/L (ref 96–106)
Creatinine, Ser: 1.07 mg/dL (ref 0.76–1.27)
GFR calc Af Amer: 82 mL/min/{1.73_m2} (ref >59)
GFR calc non Af Amer: 71 mL/min/{1.73_m2} (ref >59)
Globulin, Total: 2.8 g/dL (ref 1.5–4.5)
Glucose: 105 mg/dL — ABNORMAL HIGH (ref 65–99)
Potassium: 4 mmol/L (ref 3.5–5.2)
Sodium: 138 mmol/L (ref 134–144)
Total Protein: 7.7 g/dL (ref 6.0–8.5)

## 2019-04-17 LAB — CBC WITH DIFFERENTIAL/PLATELET
Basophils Absolute: 0.1 10*3/uL (ref 0.0–0.2)
Basos: 1 %
EOS (ABSOLUTE): 0.1 10*3/uL (ref 0.0–0.4)
Eos: 2 %
Hematocrit: 44.6 % (ref 37.5–51.0)
Hemoglobin: 14.9 g/dL (ref 13.0–17.7)
Immature Grans (Abs): 0 10*3/uL (ref 0.0–0.1)
Immature Granulocytes: 0 %
Lymphocytes Absolute: 1.9 10*3/uL (ref 0.7–3.1)
Lymphs: 34 %
MCH: 30.6 pg (ref 26.6–33.0)
MCHC: 33.4 g/dL (ref 31.5–35.7)
MCV: 92 fL (ref 79–97)
Monocytes Absolute: 0.5 10*3/uL (ref 0.1–0.9)
Monocytes: 9 %
Neutrophils Absolute: 3.1 10*3/uL (ref 1.4–7.0)
Neutrophils: 54 %
Platelets: 299 10*3/uL (ref 150–450)
RBC: 4.87 x10E6/uL (ref 4.14–5.80)
RDW: 12.8 % (ref 11.6–15.4)
WBC: 5.6 10*3/uL (ref 3.4–10.8)

## 2019-04-17 LAB — LIPID PANEL
Chol/HDL Ratio: 5.1 ratio — ABNORMAL HIGH (ref 0.0–5.0)
Cholesterol, Total: 178 mg/dL (ref 100–199)
HDL: 35 mg/dL — ABNORMAL LOW (ref >39)
LDL Chol Calc (NIH): 125 mg/dL — ABNORMAL HIGH (ref 0–99)
Triglycerides: 98 mg/dL (ref 0–149)
VLDL Cholesterol Cal: 18 mg/dL (ref 5–40)

## 2019-05-04 MED FILL — ZOLPIDEM TARTRATE 5 MG TAB: 5 | 30 days supply | Qty: 30 | Fill #2

## 2019-06-01 MED FILL — ZOLPIDEM TARTRATE 5 MG TAB: 5 | 30 days supply | Qty: 30 | Fill #3

## 2019-06-23 ENCOUNTER — Other Ambulatory Visit: Payer: Self-pay

## 2019-06-23 ENCOUNTER — Ambulatory Visit (INDEPENDENT_AMBULATORY_CARE_PROVIDER_SITE_OTHER): Payer: PPO | Admitting: Internal Medicine

## 2019-06-23 DIAGNOSIS — M170 Bilateral primary osteoarthritis of knee: Secondary | ICD-10-CM | POA: Diagnosis not present

## 2019-06-23 DIAGNOSIS — I10 Essential (primary) hypertension: Secondary | ICD-10-CM | POA: Diagnosis not present

## 2019-06-23 NOTE — Assessment & Plan Note (Addendum)
Blood pressure today is 145/82.  Heart rate 87 He has not taken his blood pressure medication this morning.  Reports SBP at home ranges 130s-140s. He has chronically taken NSAIDs for his knee pain that can be attributed to his mildly elevated blood pressure. Expecting to have better control of blood pressure when off of NSAID. -Monitor blood pressure -Continue amlodipine 5 mg daily and HCTZ 25 mg daily

## 2019-06-23 NOTE — Patient Instructions (Signed)
It was our pleasure taking care of you in our clinic today.  Today we performed steroid injection in your knees without complications. Please give [place] a call, if you develop any swelling, redness, severe pain in spite of injection, or fever or chills. We did not make any changes to her medications today.  As we talked, your blood pressure was mildly elevated today.  Please check your blood pressure at home and write down the numbers and bring the log with you on next visit.  Should you have any questions or concerns please call the internal medicine clinic at [phone].    Thank you

## 2019-06-23 NOTE — Assessment & Plan Note (Addendum)
Patient is here for ongoing bilateral knee pain and asking for steroid injections. He is taking Naproxen 500 mg once or twice a daily.  On exam has crepitus, mild swelling/trace effusion on exam. No significant tenderness. Nl ROM, No evidence of baker cyst.  No steroid injection in past 6 months. -Steroid injection of bilateral knees performed today   Procedure note: A steroid injection was performed at bilateral knees using 1% plain Lidocaine and 40 mg of Kenalog with 25 x 1.5 gauge needle. The patient was injected with a 27-gauge needle for each knee. Injection performed at the lateral aspect of medial aspect of rt knee and lateral aspect of  his left flexed knee. There were no complications. The patient tolerated the procedure well. There was minimal bleeding. The patient was instructed to ice his knee upon leaving clinic and refrain from overuse over the next 3 days. The patient was instructed to go to the emergency room with any usual pain, swelling, or redness occurred in the injected area. The patient was given a followup appointment to evaluate response to the injection to his increased range of motion and reduction of pain.

## 2019-06-23 NOTE — Progress Notes (Signed)
Internal Medicine Clinic Attending  I saw and evaluated the patient.  I personally confirmed the key portions of the history and exam documented by Dr. Myrtie Hawk and I reviewed pertinent patient test results.  The assessment, diagnosis, and plan were formulated together and I agree with the documentation in the resident's note.   I was present for the entire procedure.

## 2019-06-23 NOTE — Progress Notes (Signed)
   CC: Ongoing knee pain asking for steroid injection  HPI:  Mr.Talbert Viveros is a 69 y.o. male with PMHx as documented below, presented for bilateral knee pain and asking for steroid injection. Please refer to problem based charting for further details and assessment and plan of current problem and chronic medical conditions.   Past Medical History:  Diagnosis Date  . Hepatitis C, chronic (HCC)    genotype 2  . Hernia   . History of hepatitis C 01/22/2012   [place] w/ elastography shows F2/F3 Completed 8 weeks Mayvert on 04/29/17. 12 weeks sustained viral response was undetectable.  Will need ongoing screening for [place] recommended by U/S q 1 year, patient requests longer interval due to cost.    . Hyperlipidemia   . Hypertension   . Osteoarthritis of right knee 02/11/2014  . Personal history of colonic adenoma 03/17/2012  . Pneumonia   . STD (male)    Review of Systems:  Constitutional: Negative for chills and fever.  Respiratory: Negative for shortness of breath.   Cardiovascular: Negative for chest pain and leg swelling.  Gastrointestinal: Negative for abdominal pain, nausea and vomiting.  Neurological: Negative for dizziness and headaches.  MSK: Positive for Knee pain  Physical Exam:  Vitals:   06/23/19 1011  BP: (!) 145/82  Pulse: 87  Temp: 98.7 F (37.1 C)  TempSrc: Oral  SpO2: 95%  Weight: 233 lb 3.2 oz (105.8 kg)  Height: 5\' 10"  (1.778 m)   Constitutional: No acute distress.  HENT:  Head: Normocephalic and atraumatic.  Eyes: Conjunctivae are normal, EOM nl Cardiovascular:  RRR, nl S1S2, no murmur,  no LEE Respiratory: Effort normal and breath sounds normal. No respiratory distress. No wheezes.  GI: Soft. No distension. There is no tenderness.  Neurological: Is alert and oriented x 3  Skin: Not diaphoretic. No erythema.  Psychiatric:  Normal mood and affect. Behavior is normal. Judgment and thought content normal.  MSK: Knees: No erythema. Trace effusion. Crepitus is  present. ROM is intact   Assessment & Plan:   See Encounters Tab for problem based charting.  Patient seen with Dr. Heber [place]

## 2019-07-02 ENCOUNTER — Other Ambulatory Visit: Payer: Self-pay | Admitting: *Deleted

## 2019-07-02 MED FILL — ZOLPIDEM TARTRATE 5 MG TAB: 5 | 30 days supply | Qty: 30 | Fill #4

## 2019-07-02 MED FILL — AMLODIPINE BESYLATE 5 MG TA: 5 | 90 days supply | Qty: 90 | Fill #1

## 2019-07-03 MED ORDER — HYDROCHLOROTHIAZIDE 25 MG PO TABS: 25.0000 mg | ORAL_TABLET | Freq: Every day | ORAL | 3 refills | Status: DC

## 2019-07-03 MED FILL — HYDROCHLOROTHIAZIDE 25 MG T: 25 | 90 days supply | Qty: 90 | Fill #0

## 2019-08-03 MED FILL — ZOLPIDEM TARTRATE 5 MG TAB: 5 | 30 days supply | Qty: 30 | Fill #5

## 2019-09-03 ENCOUNTER — Other Ambulatory Visit: Payer: Self-pay

## 2019-09-03 ENCOUNTER — Encounter: Payer: Self-pay | Admitting: Internal Medicine

## 2019-09-03 ENCOUNTER — Other Ambulatory Visit: Payer: Self-pay | Admitting: Internal Medicine

## 2019-09-03 ENCOUNTER — Ambulatory Visit (INDEPENDENT_AMBULATORY_CARE_PROVIDER_SITE_OTHER): Payer: PPO | Admitting: Internal Medicine

## 2019-09-03 VITALS — BP 147/75 | HR 72 | Temp 97.9°F | Ht 70.0 in | Wt 238.1 lb

## 2019-09-03 DIAGNOSIS — M170 Bilateral primary osteoarthritis of knee: Secondary | ICD-10-CM | POA: Diagnosis not present

## 2019-09-03 DIAGNOSIS — I10 Essential (primary) hypertension: Secondary | ICD-10-CM | POA: Diagnosis not present

## 2019-09-03 DIAGNOSIS — F5101 Primary insomnia: Secondary | ICD-10-CM

## 2019-09-03 MED ORDER — ZOLPIDEM TARTRATE 5 MG PO TABS: ORAL_TABLET | ORAL | 5 refills | Status: DC

## 2019-09-03 MED ORDER — BENAZEPRIL HCL 10 MG PO TABS: 10.0000 mg | ORAL_TABLET | Freq: Every day | ORAL | 11 refills | Status: DC

## 2019-09-03 MED FILL — ZOLPIDEM TARTRATE 5 MG TAB: 5 | 30 days supply | Qty: 30 | Fill #0

## 2019-09-03 MED FILL — BENAZEPRIL HCL 10 MG TABLET: 10 | 30 days supply | Qty: 30 | Fill #0

## 2019-09-03 NOTE — Progress Notes (Signed)
Subjective:  HPI: [name] is a 69 y.o. male who presents for f/u knee OA, HTN  Please see Assessment and Plan below for the status of his chronic medical problems.  Objective:  Physical Exam: Vitals:   09/03/19 0820 09/03/19 0908  BP: (!) 170/90 (!) 147/75  Pulse: 78 72  Temp: 97.9 F (36.6 C)   TempSrc: Oral   SpO2: 97%   Weight: 238 lb 1.6 oz (108 kg)   Height: 5\' 10"  (1.778 m)    Body mass index is 34.16 kg/m. Physical Exam Vitals and nursing note reviewed.  Constitutional:      Appearance: Normal appearance.  Cardiovascular:     Rate and Rhythm: Normal rate and regular rhythm.  Pulmonary:     Effort: Pulmonary effort is normal.  Musculoskeletal:     Right knee: Tenderness present over the medial joint line and lateral joint line. No LCL laxity, MCL laxity, ACL laxity or PCL laxity.     Left knee: Tenderness present over the medial joint line and lateral joint line. No LCL laxity, MCL laxity, ACL laxity or PCL laxity. Neurological:     Mental Status: He is alert.    Assessment & Plan:  See Encounters Tab for problem based charting.  Medications Ordered Meds ordered this encounter  Medications  . zolpidem (AMBIEN) 5 MG tablet    Sig: TAKE 1 TABLET BY MOUTH BEFORE BEDTIME AS NEEDED    Dispense:  30 tablet    Refill:  5  . benazepril (LOTENSIN) 10 MG tablet    Sig: Take 1 tablet (10 mg total) by mouth daily.    Dispense:  30 tablet    Refill:  11   Other Orders Orders Placed This Encounter  Procedures  . For home use only DME Other see comment    Knee brace bilateral knees    Order Specific Question:   Length of Need    Answer:   Lifetime  . BMP8+Anion Gap   Follow Up: Return 4-6 weeks with Dr Heber [place] for BP recheck and labs.   PROCEDURE NOTE  PROCEDURE: right knee joint steroid injection.  PREOPERATIVE DIAGNOSIS: Osteoarthritis of the bilateral knee.  POSTOPERATIVE DIAGNOSIS: Osteoarthritis of the bilateral knee.  PROCEDURE: The  patient was apprised of the risks and the benefits of the procedure and informed consent was obtained. Time-out procedure was performed, with confirmation of the patient's name, date of birth, and correct identification of the right knee to be injected. The patient's knee was then marked at the appropriate site for injection placement. The knee was sterilely prepped with Betadine. A 40 mg (1 milliliter) solution of Kenalog was drawn up into a 5 mL syringe with a 2 mL of 1% lidocaine. The patient was injected with a 25-gauge needle at the anterior medial aspect of his right flexed knee. There were no complications. The patient tolerated the procedure well. There was minimal bleeding. The patient was instructed to ice his knee upon leaving clinic and refrain from overuse over the next 3 days. The patient was instructed to go to the emergency room with any usual pain, swelling, or redness occurred in the injected area. The patient was given a followup appointment to evaluate response to the injection to his increased range of motion and reduction of pain.  PROCEDURE NOTE  PROCEDURE: left knee joint steroid injection.  PREOPERATIVE DIAGNOSIS: Osteoarthritis of the bilateral knee.  POSTOPERATIVE DIAGNOSIS: Osteoarthritis of the bilateral knee.  PROCEDURE: The patient was apprised of the risks  and the benefits of the procedure and informed consent was obtained. Time-out procedure was performed, with confirmation of the patient's name, date of birth, and correct identification of the left knee to be injected. The patient's knee was then marked at the appropriate site for injection placement. The knee was sterilely prepped with Betadine. A 40 mg (1 milliliter) solution of Kenalog was drawn up into a 5 mL syringe with a 2 mL of 1% lidocaine. The patient was injected with a 25-gauge needle at the anterior medial aspect of his left flexed knee. There were no complications. The patient tolerated the procedure well.  There was minimal bleeding. The patient was instructed to ice his knee upon leaving clinic and refrain from overuse over the next 3 days. The patient was instructed to go to the emergency room with any usual pain, swelling, or redness occurred in the injected area. The patient was given a followup appointment to evaluate response to the injection to his increased range of motion and reduction of pain.

## 2019-09-03 NOTE — Patient Instructions (Signed)
I will have you start taking Benazepril 10mg  a day in addition to your other blood pressure medications, I want to see you back in about a month for some additional blood work and recheck of your blood pressure.

## 2019-09-04 LAB — BMP8+ANION GAP
Anion Gap: 19 mmol/L — ABNORMAL HIGH (ref 10.0–18.0)
BUN/Creatinine Ratio: 16 (ref 10–24)
BUN: 15 mg/dL (ref 8–27)
CO2: 17 mmol/L — ABNORMAL LOW (ref 20–29)
Calcium: 9.7 mg/dL (ref 8.6–10.2)
Chloride: 105 mmol/L (ref 96–106)
Creatinine, Ser: 0.92 mg/dL (ref 0.76–1.27)
GFR calc Af Amer: 98 mL/min/{1.73_m2} (ref >59)
GFR calc non Af Amer: 85 mL/min/{1.73_m2} (ref >59)
Glucose: 107 mg/dL — ABNORMAL HIGH (ref 65–99)
Potassium: 4 mmol/L (ref 3.5–5.2)
Sodium: 141 mmol/L (ref 134–144)

## 2019-09-04 NOTE — Assessment & Plan Note (Signed)
HPI: Reports taking amlodipine and hydrochlorothiazide daily as directed no side effects.  Denies headaches.  Denies orthostasis.  Assessment essential hypertension mildly above goal  Plan Last few visits have continued to have blood pressure readings above goal given he does not have diabetes a goal less than 237/02 is certainly reasonable.  We'll add benazepril 10 mg daily have him back in about 4 to 6 weeks for repeat blood pressure and BMP
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
## 2019-09-04 NOTE — Assessment & Plan Note (Signed)
HPI: His insomnia remains well controlled with the use of Ambien 5 mg as needed.  Assessment insomnia  Plan Continue Ambien 5 mg nightly.

## 2019-10-03 MED FILL — BENAZEPRIL HCL 10 MG TABLET: 10 | 30 days supply | Qty: 30 | Fill #1

## 2019-10-03 MED FILL — AMLODIPINE BESYLATE 5 MG TA: 5 | 90 days supply | Qty: 90 | Fill #2

## 2019-10-03 MED FILL — ZOLPIDEM TARTRATE 5 MG TAB: 5 | 30 days supply | Qty: 30 | Fill #1

## 2019-10-08 ENCOUNTER — Ambulatory Visit: Payer: PPO

## 2019-10-08 ENCOUNTER — Other Ambulatory Visit: Payer: Self-pay

## 2019-10-08 ENCOUNTER — Ambulatory Visit (INDEPENDENT_AMBULATORY_CARE_PROVIDER_SITE_OTHER): Payer: PPO

## 2019-10-08 DIAGNOSIS — Z23 Encounter for immunization: Secondary | ICD-10-CM

## 2019-10-08 DIAGNOSIS — I10 Essential (primary) hypertension: Secondary | ICD-10-CM

## 2019-10-08 DIAGNOSIS — M170 Bilateral primary osteoarthritis of knee: Secondary | ICD-10-CM

## 2019-10-08 NOTE — Chronic Care Management (AMB) (Signed)
  Chronic Care Management   Outreach Note  10/08/2019 Name: Scott Burgess MRN: [id number] DOB: Aug 31, 1950  Referred by: Lucious Groves, DO Reason for referral : Care Coordination (transportation/[place] application )   Was asked to assist with completion of [place] application that was dropped off by patient. Completed portion of application via chart review but do need to speak to patient directly for additional information.  Left message for patient requesting return call. Can assist with completion when/if patient returns call.      Ronn Melena, [place] Coordination Social Worker [place] [phone]

## 2019-10-20 ENCOUNTER — Ambulatory Visit: Payer: PPO | Attending: Internal Medicine

## 2019-10-20 DIAGNOSIS — Z23 Encounter for immunization: Secondary | ICD-10-CM

## 2019-10-20 NOTE — Progress Notes (Signed)
° °  Covid-19 Vaccination Clinic  Name:  Scott Burgess    MRN: [id number] DOB: 08-31-1950  10/20/2019  [name] was observed post Covid-19 immunization for 15 minutes without incident. He was provided with Vaccine Information Sheet and instruction to access the V-Safe system.   [name] was instructed to call 911 with any severe reactions post vaccine:  Difficulty breathing   Swelling of face and throat   A fast heartbeat   A bad rash all over body   Dizziness and weakness

## 2019-11-10 MED FILL — ZOLPIDEM TARTRATE 5 MG TAB: 5 | 30 days supply | Qty: 30 | Fill #2

## 2019-11-26 ENCOUNTER — Encounter: Payer: PPO | Admitting: Internal Medicine

## 2019-11-30 ENCOUNTER — Ambulatory Visit (INDEPENDENT_AMBULATORY_CARE_PROVIDER_SITE_OTHER): Payer: PPO | Admitting: Internal Medicine

## 2019-11-30 ENCOUNTER — Encounter: Payer: Self-pay | Admitting: Internal Medicine

## 2019-11-30 VITALS — BP 148/85 | HR 81 | Temp 97.8°F | Ht 70.0 in | Wt 221.1 lb

## 2019-11-30 DIAGNOSIS — I10 Essential (primary) hypertension: Secondary | ICD-10-CM

## 2019-11-30 DIAGNOSIS — M170 Bilateral primary osteoarthritis of knee: Secondary | ICD-10-CM

## 2019-11-30 NOTE — Assessment & Plan Note (Signed)
Vitals:   11/30/19 1452  BP: (!) 148/85  Pulse: 81  Temp: 97.8 F (36.6 C)  SpO2: 97%   Repeat blood pressure 120/68.  Patient currently on amlodipine 5 mg, hydrochlorothiazide 25 mg and benazepril 10 mg.  Plan to continue current antihypertensive regimen.  Will check BMP today.

## 2019-11-30 NOTE — Assessment & Plan Note (Signed)
Patient has a history of knock knees since childhood and additional traumatic injuries.  No with bilateral osteoarthritis.  He currently works at [employer] and stands most of the day, doing reasonably well.  He uses over-the-counter knee sleeves and a lidocaine cream.  Requesting repeat steroid injections for bilateral knees today.   PROCEDURE NOTE  PROCEDURE: right knee joint steroid injection.  PREOPERATIVE DIAGNOSIS: Osteoarthritis of the right knee.  POSTOPERATIVE DIAGNOSIS: Osteoarthritis of the right knee.  PROCEDURE: The patient was apprised of the risks and the benefits of the procedure and informed consent was obtained, as witnessed by Drema Balzarine, Therapist, sports. Time-out procedure was performed, with confirmation of the patient's name, date of birth, and correct identification of the right knee to be injected. The patient's knee was then marked at the appropriate site for injection placement. The knee was sterilely prepped with Betadine. A 40mg  (1 milliliter) solution of Kenalog was drawn up into a 5 mL syringe with a 71mL of 1% lidocaine. The patient was injected with a 25-gauge needle at the medial aspect of his right flexed knee. There were no complications. The patient tolerated the procedure well. There was minimal bleeding. The patient was instructed to ice his knee upon leaving clinic and refrain from overuse over the next 3 days. The patient was instructed to go to the emergency room with any usual pain, swelling, or redness occurred in the injected area. The patient was given a followup appointment to evaluate response to the injection to his increased range of motion and reduction of pain.  The procedure was supervised by attending physician, Dr. Heber [place].     PROCEDURE NOTE  PROCEDURE: left knee joint steroid injection.  PREOPERATIVE DIAGNOSIS: Osteoarthritis of the left knee.  POSTOPERATIVE DIAGNOSIS: Osteoarthritis of the left knee.  PROCEDURE: The patient was apprised of the risks  and the benefits of the procedure and informed consent was obtained, as witnessed by Drema Balzarine, Therapist, sports. Time-out procedure was performed, with confirmation of the patient's name, date of birth, and correct identification of the left knee to be injected. The patient's knee was then marked at the appropriate site for injection placement. The knee was sterilely prepped with Betadine. A 40 mg (1 milliliter) solution of Kenalog was drawn up into a 5 mL syringe with a 2 mL of 1% lidocaine. The patient was injected with a 25-gauge needle at the lateral aspect of his left flexed knee. There were no complications. The patient tolerated the procedure well. There was minimal bleeding. The patient was instructed to ice @HIS @ knee upon leaving clinic and refrain from overuse over the next 3 days. The patient was instructed to go to the emergency room with any usual pain, swelling, or redness occurred in the injected area. The patient was given a followup appointment to evaluate response to the injection to his increased range of motion and reduction of pain.  The procedure was supervised by attending physician, Dr. Heber .

## 2019-11-30 NOTE — Patient Instructions (Signed)
Mr. Franssen,  It was a pleasure meeting you today. Today we discussed the following:  1. Hypertension: Your blood pressure initially was elevated, but on recheck it was perfect, 120/68. I want you to continue taking the three medications as prescribed- amlodipine 5 mg, hydrochlorothiazide 25 mg and benazepril 10 mg.  2. Osteoarthritis: Today you received steroid injections for both your knees.   Thanks for allowing [place] to be a part of your care!

## 2019-11-30 NOTE — Progress Notes (Signed)
   CC: HTN, bilateral knee pain  HPI:  [name] is a 69 y.o. with a PMHx listed below presenting for follow up regarding his HTN and bilateral knee osteoarthritis. For details of today's visit and the status of his chronic medical issues please refer to the assessment and plan.   Past Medical History:  Diagnosis Date  . Hepatitis C, chronic (HCC)    genotype 2  . Hernia   . History of hepatitis C 01/22/2012   [place] w/ elastography shows F2/F3 Completed 8 weeks Mayvert on 04/29/17. 12 weeks sustained viral response was undetectable.  Will need ongoing screening for Scott Burgess recommended by [place] q 1 year, patient requests longer interval due to cost.    . Hyperlipidemia   . Hypertension   . Osteoarthritis of right knee 02/11/2014  . Personal history of colonic adenoma 03/17/2012  . Pneumonia   . STD (male)    Review of Systems:   Review of Systems  Constitutional: Negative for chills, fever and malaise/fatigue.  Respiratory: Negative for cough and shortness of breath.   Cardiovascular: Negative for chest pain, palpitations and leg swelling.  Gastrointestinal: Negative for abdominal pain, constipation, diarrhea, nausea and vomiting.  Musculoskeletal: Positive for back pain, falls and joint pain.  Neurological: Negative for weakness and headaches.     Physical Exam:  Vitals:   11/30/19 1452  BP: (!) 148/85  Pulse: 81  Temp: 97.8 F (36.6 C)  TempSrc: Oral  SpO2: 97%  Weight: 221 lb 1.6 oz (100.3 kg)  Height: 5\' 10"  (1.778 m)   Physical Exam Vitals reviewed.  Constitutional:      General: He is not in acute distress.    Appearance: Normal appearance. He is not ill-appearing.  Cardiovascular:     Rate and Rhythm: Normal rate and regular rhythm.     Pulses: Normal pulses.     Heart sounds: Normal heart sounds. No murmur heard.  No friction rub. No gallop.   Pulmonary:     Effort: Pulmonary effort is normal. No respiratory distress.     Breath sounds: Normal breath sounds.  No wheezing or rales.  Abdominal:     General: Abdomen is flat. Bowel sounds are normal. There is no distension.     Palpations: Abdomen is soft.     Tenderness: There is no abdominal tenderness.  Musculoskeletal:        General: No swelling.     Right lower leg: No edema.     Left lower leg: No edema.  Skin:    General: Skin is warm and dry.  Neurological:     General: No focal deficit present.     Mental Status: He is alert and oriented to person, place, and time. Mental status is at baseline.  Psychiatric:        Mood and Affect: Mood normal.        Behavior: Behavior normal.        Thought Content: Thought content normal.        Judgment: Judgment normal.     Assessment & Plan:   See Encounters Tab for problem based charting.  Patient seen with Dr. Heber [place]

## 2019-12-01 LAB — BMP8+ANION GAP
Anion Gap: 17 mmol/L (ref 10.0–18.0)
BUN/Creatinine Ratio: 14 (ref 10–24)
BUN: 14 mg/dL (ref 8–27)
CO2: 21 mmol/L (ref 20–29)
Calcium: 9.8 mg/dL (ref 8.6–10.2)
Chloride: 105 mmol/L (ref 96–106)
Creatinine, Ser: 0.99 mg/dL (ref 0.76–1.27)
GFR calc Af Amer: 90 mL/min/{1.73_m2} (ref >59)
GFR calc non Af Amer: 78 mL/min/{1.73_m2} (ref >59)
Glucose: 89 mg/dL (ref 65–99)
Potassium: 4 mmol/L (ref 3.5–5.2)
Sodium: 143 mmol/L (ref 134–144)

## 2019-12-01 NOTE — Progress Notes (Signed)
Internal Medicine Clinic Attending  I saw and evaluated the patient.  I personally confirmed the key portions of the history and exam documented by Dr. Laural Golden and I reviewed pertinent patient test results.  The assessment, diagnosis, and plan were formulated together and I agree with the documentation in the resident's note. I was present for both procedures.

## 2019-12-08 ENCOUNTER — Other Ambulatory Visit: Payer: Self-pay | Admitting: Internal Medicine

## 2019-12-08 DIAGNOSIS — I159 Secondary hypertension, unspecified: Secondary | ICD-10-CM

## 2019-12-08 MED FILL — ZOLPIDEM TARTRATE 5 MG TAB: 5 | 30 days supply | Qty: 30 | Fill #3

## 2019-12-08 MED FILL — BENAZEPRIL HCL 10 MG TABLET: 10 | 30 days supply | Qty: 30 | Fill #2

## 2019-12-08 MED FILL — HYDROCHLOROTHIAZIDE 25 MG T: 25 | 90 days supply | Qty: 90 | Fill #1

## 2019-12-11 MED FILL — AMLODIPINE BESYLATE 5 MG TA: 5 | 90 days supply | Qty: 90 | Fill #0

## 2019-12-23 ENCOUNTER — Encounter: Payer: Self-pay | Admitting: *Deleted

## 2019-12-23 NOTE — Progress Notes (Signed)

## 2019-12-28 NOTE — Progress Notes (Signed)
Things That May Be Affecting Your Health:  Alcohol  Hearing loss  Pain   x Depression  Home Safety  Sexual Health   Diabetes  Lack of physical activity x Stress   Difficulty with daily activities  Loneliness  Tiredness   Drug use  Medicines  Tobacco use   Falls  Motor Vehicle Safety  Weight   Food choices  Oral Health x Other  Other Ask about living situation/work.  Recently moved out of [employer] and started working at [employer] : 1. Schedule your next subsequent Medicare Wellness visit in one year 2. Attend all of your regular appointments to address your medical issues 3. Complete the preventative screenings and services   Annual Wellness Visit   Medicare Covered Preventative Screenings and [place] Men and Women Who How Often Need? Date of Last Service Action  Abdominal Aortic Aneurysm Adults with AAA risk factors Once n 03/2015   Alcohol Misuse and Counseling All Adults Screening once a year if no alcohol misuse. Counseling up to 4 face to face sessions. y    Bone Density Measurement  Adults at risk for osteoporosis Once every 2 yrs n    Lipid Panel Z13.6 All adults without CV disease Once every 5 yrs n    Colorectal Cancer   Stool sample or  Colonoscopy All adults 31 and older   Once every year  Every 10 years y 2014 Due for 7 year follow up  Depression All Adults Once a year  Today   Diabetes Screening Blood glucose, post glucose load, or GTT Z13.1  All adults at risk  Pre-diabetics  Once per year  Twice per year n 11/30/19   Diabetes  Self-Management Training All adults Diabetics 10 hrs first year; 2 hours subsequent years. Requires Copay n    Glaucoma  Diabetics  Family history of glaucoma  African Americans 45 yrs +  Hispanic Americans 61 yrs + Annually - requires coppay y    Hepatitis C Z72.89 or F19.20  High Risk for HCV  Born between 1945 and 1965  Annually  Once n    HIV Z11.4 All  adults based on risk  Annually btw ages 74 & 76 regardless of risk  Annually > 65 yrs if at increased risk n    Lung Cancer Screening Asymptomatic adults aged 60-77 with 30 pack yr history and current smoker OR quit within the last 15 yrs Annually Must have counseling and shared decision making documentation before first screen n    Medical Nutrition Therapy Adults with   Diabetes  Renal disease  Kidney transplant within past 3 yrs 3 hours first year; 2 hours subsequent years n    Obesity and Counseling All adults Screening once a year Counseling if BMI 30 or higher  Today   Tobacco Use Counseling Adults who use tobacco  Up to 8 visits in one year     Vaccines Z23  Hepatitis B  Influenza   Pneumonia  Adults   Once  Once every flu season  Two different vaccines separated by one year n    Next Annual Wellness Visit People with Medicare Every year  Today     Services & Screenings Women Who How Often Need  Date of Last Service Action  Mammogram  Z12.31 Women over 35 One baseline ages 71-39. Annually ager 40 yrs+     Pap tests All women Annually if high risk. Every 2 yrs for  normal risk women     Screening for cervical cancer with   Pap (Z01.419 nl or Z01.411abnl) &  HPV Z11.51 Women aged 64 to 42 Once every 5 yrs     Screening pelvic and breast exams All women Annually if high risk. Every 2 yrs for normal risk women     Sexually Transmitted Diseases  Chlamydia  Gonorrhea  Syphilis All at risk adults Annually for non pregnant females at increased risk         [place] Men Who How Ofter Need  Date of Last Service Action  Prostate Cancer - DRE & PSA Men over 50 Annually.  DRE might require a copay. y    Sexually Transmitted Diseases  Syphilis All at risk adults Annually for men at increased risk n

## 2020-01-11 MED FILL — ZOLPIDEM TARTRATE 5 MG TAB: 5 | 30 days supply | Qty: 30 | Fill #4

## 2020-02-10 MED FILL — ZOLPIDEM TARTRATE 5 MG TAB: 5 | 30 days supply | Qty: 30 | Fill #5

## 2020-03-03 ENCOUNTER — Ambulatory Visit (INDEPENDENT_AMBULATORY_CARE_PROVIDER_SITE_OTHER): Payer: PPO | Admitting: Internal Medicine

## 2020-03-03 ENCOUNTER — Other Ambulatory Visit: Payer: Self-pay

## 2020-03-03 ENCOUNTER — Encounter: Payer: Self-pay | Admitting: Internal Medicine

## 2020-03-03 ENCOUNTER — Other Ambulatory Visit: Payer: Self-pay | Admitting: Internal Medicine

## 2020-03-03 VITALS — BP 164/92 | HR 81 | Temp 97.8°F | Ht 70.0 in | Wt 226.4 lb

## 2020-03-03 DIAGNOSIS — I739 Peripheral vascular disease, unspecified: Secondary | ICD-10-CM | POA: Diagnosis not present

## 2020-03-03 DIAGNOSIS — I10 Essential (primary) hypertension: Secondary | ICD-10-CM | POA: Diagnosis not present

## 2020-03-03 DIAGNOSIS — Z86010 Personal history of colonic polyps: Secondary | ICD-10-CM

## 2020-03-03 DIAGNOSIS — M170 Bilateral primary osteoarthritis of knee: Secondary | ICD-10-CM

## 2020-03-03 MED ORDER — OLMESARTAN-AMLODIPINE-HCTZ 20-5-12.5 MG PO TABS
1.0000 | ORAL_TABLET | Freq: Every day | ORAL | 11 refills | Status: DC
Start: 2020-03-03 — End: 2020-03-03

## 2020-03-03 MED ORDER — ROSUVASTATIN CALCIUM 20 MG PO TABS: 20.0000 mg | ORAL_TABLET | Freq: Every day | ORAL | 11 refills | Status: DC

## 2020-03-03 MED FILL — OLMSRTN-AMLDPN-HCTZ 20-5-12: 20-5-12.5 | 30 days supply | Qty: 30 | Fill #0

## 2020-03-03 MED FILL — ROSUVASTATIN CALCIUM 20 MG: 20 | 30 days supply | Qty: 30 | Fill #0

## 2020-03-03 NOTE — Patient Instructions (Signed)
We are changing your blood pressure medication to a 3 medication combination pill.  I want you to check your blood pressure at home and call me in blood pressure is above 150/90 consistently as we will need to see you sooner than 3 months.

## 2020-03-03 NOTE — Assessment & Plan Note (Signed)
He is due for repeat colonoscopy with Dr. Carlean Purl will place referral.

## 2020-03-03 NOTE — Assessment & Plan Note (Signed)
HPI: Overall he reports he is doing well no headaches blurry vision or orthostasis symptoms.  Currently taking 10 mg of benazepril 5 mg of amlodipine and 25 mg of hydrochlorothiazide.  Assessment essential hypertension not at goal  Plan Reviewed his insurance formulary appears that Tribenzor generic should be an affordable option.  I think this will simplify his medication regiment and also provide a better blood pressure control with olmesartan instead of benazepril. Start olmesartan amlodipine hydrochlorothiazide 20-5-12.5,   patient will let me know if it is unaffordable he has a blood pressure cuff and will check blood pressures at home will notify me for sooner appointment if blood pressure is uncontrolled.

## 2020-03-03 NOTE — Assessment & Plan Note (Signed)
Currently asymptomatic. Continue 81 mg of aspirin he has not been taking atorvastatin regularly concerned does not agree with him but cannot be more specific.  Change to Crestor 20 mg daily

## 2020-03-03 NOTE — Progress Notes (Signed)
Subjective:  HPI: [name] is a 70 y.o. male who presents for HTN he tells me he is now working at [employer].  Please see Assessment and Plan below for the status of his chronic medical problems.  Objective:  Physical Exam: Vitals:   03/03/20 0836 03/03/20 0933  BP: (!) 174/91 (!) 164/92  Pulse: 84 81  Temp: 97.8 F (36.6 C)   TempSrc: Oral   SpO2: 95%   Weight: 226 lb 6.4 oz (102.7 kg)   Height: 5\' 10"  (1.778 m)    Body mass index is 32.49 kg/m. Physical Exam Assessment & Plan:  See Encounters Tab for problem based charting.  Medications Ordered Meds ordered this encounter  Medications  . Olmesartan-amLODIPine-HCTZ 20-5-12.5 MG TABS    Sig: Take 1 tablet by mouth daily.    Dispense:  30 tablet    Refill:  11    Patient would like to pick up 03/09/20.  This replaces individual prescriptions of amlodipine, HCTZ, and Benazepril.  . rosuvastatin (CRESTOR) 20 MG tablet    Sig: Take 1 tablet (20 mg total) by mouth daily.    Dispense:  30 tablet    Refill:  11    Patient would like to pick up 03/09/20. Replaces Atorvastatin   Other Orders Orders Placed This Encounter  Procedures  . Lipid Profile  . BMP8+Anion Gap  . Ambulatory referral to Gastroenterology    Referral Priority:   Routine    Referral Type:   Consultation    Referral Reason:   Specialty Services Required    Referred to Provider:   Gatha Mayer, MD    Number of Visits Requested:   1   Follow Up: Return in about 3 months (around 05/31/2020).  PROCEDURE NOTE  PROCEDURE: right knee joint steroid injection.  PREOPERATIVE DIAGNOSIS: Osteoarthritis of the bilateral knee.  POSTOPERATIVE DIAGNOSIS: Osteoarthritis of the bilateral knee.  PROCEDURE: The patient was apprised of the risks and the benefits of the procedure and informed consent was obtained. Time-out procedure was performed, with confirmation of the patient's name, date of birth, and correct identification of the right knee to be  injected. The patient's knee was then marked at the appropriate site for injection placement. The knee was sterilely prepped with Betadine. A 40 mg (1 milliliter) solution of Kenalog was drawn up into a 5 mL syringe with a 2 mL of 1% lidocaine. The patient was injected with a 25-gauge needle at the anteriolateral aspect of his right flexed knee. There were no complications. The patient tolerated the procedure well. There was minimal bleeding. The patient was instructed to ice his knee upon leaving clinic and refrain from overuse over the next 3 days. The patient was instructed to go to the emergency room with any usual pain, swelling, or redness occurred in the injected area. The patient was given a followup appointment to evaluate response to the injection to his increased range of motion and reduction of pain.  PROCEDURE NOTE  PROCEDURE: left knee joint steroid injection.  PREOPERATIVE DIAGNOSIS: Osteoarthritis of the bilateral knee.  POSTOPERATIVE DIAGNOSIS: Osteoarthritis of the bilateral knee.  PROCEDURE: The patient was apprised of the risks and the benefits of the procedure and informed consent was obtained. Time-out procedure was performed, with confirmation of the patient's name, date of birth, and correct identification of the left knee to be injected. The patient's knee was then marked at the appropriate site for injection placement. The knee was sterilely prepped with Betadine. A 40 mg (  1 milliliter) solution of Kenalog was drawn up into a 5 mL syringe with a 2 mL of 1% lidocaine. The patient was injected with a 25-gauge needle at the anteriolateral aspect of his left flexed knee. There were no complications. The patient tolerated the procedure well. There was minimal bleeding. The patient was instructed to ice his knee upon leaving clinic and refrain from overuse over the next 3 days. The patient was instructed to go to the emergency room with any usual pain, swelling, or redness occurred in  the injected area. The patient was given a followup appointment to evaluate response to the injection to his increased range of motion and reduction of pain.

## 2020-03-03 NOTE — Assessment & Plan Note (Signed)
HPI: Reports he has new job with [employer] has him on his feet more.  However his steroid injections last August worked remarkably well and notes that he is just now back in pain about 5 months later.  He is requesting bilateral steroid injections to his knees again.  Assessment osteoarthritis of bilateral knees  Plan Performed bilateral knee injection today.

## 2020-03-04 LAB — LIPID PANEL
Chol/HDL Ratio: 4.5 ratio (ref 0.0–5.0)
Cholesterol, Total: 172 mg/dL (ref 100–199)
HDL: 38 mg/dL — ABNORMAL LOW (ref >39)
LDL Chol Calc (NIH): 120 mg/dL — ABNORMAL HIGH (ref 0–99)
Triglycerides: 75 mg/dL (ref 0–149)
VLDL Cholesterol Cal: 14 mg/dL (ref 5–40)

## 2020-03-04 LAB — BMP8+ANION GAP
Anion Gap: 16 mmol/L (ref 10.0–18.0)
BUN/Creatinine Ratio: 16 (ref 10–24)
BUN: 12 mg/dL (ref 8–27)
CO2: 19 mmol/L — ABNORMAL LOW (ref 20–29)
Calcium: 9.5 mg/dL (ref 8.6–10.2)
Chloride: 104 mmol/L (ref 96–106)
Creatinine, Ser: 0.74 mg/dL — ABNORMAL LOW (ref 0.76–1.27)
GFR calc Af Amer: 110 mL/min/{1.73_m2} (ref >59)
GFR calc non Af Amer: 95 mL/min/{1.73_m2} (ref >59)
Glucose: 102 mg/dL — ABNORMAL HIGH (ref 65–99)
Potassium: 4 mmol/L (ref 3.5–5.2)
Sodium: 139 mmol/L (ref 134–144)

## 2020-03-08 ENCOUNTER — Other Ambulatory Visit: Payer: Self-pay | Admitting: Internal Medicine

## 2020-03-08 DIAGNOSIS — F5101 Primary insomnia: Secondary | ICD-10-CM

## 2020-03-08 MED ORDER — ZOLPIDEM TARTRATE 5 MG PO TABS: ORAL_TABLET | ORAL | 5 refills | Status: DC

## 2020-03-08 NOTE — Telephone Encounter (Signed)
Refill Request  zolpidem (AMBIEN) 5 MG tablet  [facility] - [place], [place] - [address].

## 2020-03-09 MED FILL — ZOLPIDEM TARTRATE 5 MG TAB: 5 | 30 days supply | Qty: 30 | Fill #0

## 2020-03-14 ENCOUNTER — Encounter: Payer: Self-pay | Admitting: Internal Medicine

## 2020-04-05 MED FILL — ROSUVASTATIN CALCIUM 20 MG: 20 | 30 days supply | Qty: 30 | Fill #1

## 2020-04-05 MED FILL — OLMSRTN-AMLDPN-HCTZ 20-5-12: 20-5-12.5 | 30 days supply | Qty: 30 | Fill #1

## 2020-04-06 MED FILL — ZOLPIDEM TARTRATE 5 MG TAB: 5 | 30 days supply | Qty: 30 | Fill #1

## 2020-05-11 ENCOUNTER — Ambulatory Visit (AMBULATORY_SURGERY_CENTER): Payer: Self-pay | Admitting: *Deleted

## 2020-05-11 ENCOUNTER — Other Ambulatory Visit: Payer: Self-pay

## 2020-05-11 VITALS — Ht 70.0 in | Wt 214.0 lb

## 2020-05-11 DIAGNOSIS — Z86010 Personal history of colonic polyps: Secondary | ICD-10-CM

## 2020-05-11 NOTE — Progress Notes (Signed)
Patient is here in-person for PV. Patient denies any allergies to eggs or soy. Patient denies any problems with anesthesia/sedation. Patient denies any oxygen use at home. Patient denies taking any diet/weight loss medications or blood thinners. Patient is not being treated for MRSA or C-diff. Patient is aware of our care-partner policy and Covid-19 safety protocol.  Patient is fully COVID-19 vaccinated, per patient.    

## 2020-05-25 ENCOUNTER — Other Ambulatory Visit: Payer: Self-pay

## 2020-05-25 ENCOUNTER — Encounter: Payer: Self-pay | Admitting: Internal Medicine

## 2020-05-25 ENCOUNTER — Ambulatory Visit (AMBULATORY_SURGERY_CENTER): Payer: PPO | Admitting: Internal Medicine

## 2020-05-25 VITALS — BP 116/73 | HR 77 | Temp 97.1°F | Resp 19 | Ht 70.0 in | Wt 214.0 lb

## 2020-05-25 DIAGNOSIS — D123 Benign neoplasm of transverse colon: Secondary | ICD-10-CM | POA: Diagnosis not present

## 2020-05-25 DIAGNOSIS — Z86010 Personal history of colonic polyps: Secondary | ICD-10-CM | POA: Diagnosis not present

## 2020-05-25 DIAGNOSIS — D120 Benign neoplasm of cecum: Secondary | ICD-10-CM

## 2020-05-25 MED ORDER — FLEET ENEMA 7-19 GM/118ML RE ENEM
1.0000 | ENEMA | Freq: Once | RECTAL | Status: AC
  Administered 2020-05-25: 1 via RECTAL

## 2020-05-25 MED ORDER — SODIUM CHLORIDE 0.9 % IV SOLN: 500.0000 mL | Freq: Once | INTRAVENOUS | Status: DC

## 2020-05-25 NOTE — Progress Notes (Signed)
No medical changes. Vitals- [facility]

## 2020-05-25 NOTE — Op Note (Signed)
[place] Patient Name: [facility] Procedure Date: 05/25/2020 11:50 AM MRN: [id number] Endoscopist: Gatha Mayer , MD Age: 70 Referring MD:  Date of Birth: Jan 12, 1951 Gender: Male Account #: [id number] Procedure:                Colonoscopy Indications:              Surveillance: Personal history of adenomatous                            polyps on last colonoscopy > 5 years ago Medicines:                Propofol per Anesthesia, Monitored Anesthesia Care Procedure:                Pre-Anesthesia Assessment:                           - Prior to the procedure, a History and Physical                            was performed, and patient medications and                            allergies were reviewed. The patient's tolerance of                            previous anesthesia was also reviewed. The risks                            and benefits of the procedure and the sedation                            options and risks were discussed with the patient.                            All questions were answered, and informed consent                            was obtained. Prior Anticoagulants: The patient has                            taken no previous anticoagulant or antiplatelet                            agents. ASA Grade Assessment: II - A patient with                            mild systemic disease. After reviewing the risks                            and benefits, the patient was deemed in                            satisfactory condition to undergo the procedure.  After obtaining informed consent, the colonoscope                            was passed under direct vision. Throughout the                            procedure, the patient's blood pressure, pulse, and                            oxygen saturations were monitored continuously. The                            Olympus CF-HQ190L ([id number]) Colonoscope was                             introduced through the anus and advanced to the the                            cecum, identified by appendiceal orifice and                            ileocecal valve. The colonoscopy was performed                            without difficulty. The patient tolerated the                            procedure well. The quality of the bowel                            preparation was good. The ileocecal valve,                            appendiceal orifice, and rectum were photographed.                            The bowel preparation used was Miralax via split                            dose instruction. Scope In: 11:55:05 AM Scope Out: 12:10:22 PM Scope Withdrawal Time: 0 hours 11 minutes 37 seconds  Total Procedure Duration: 0 hours 15 minutes 17 seconds  Findings:                 The perianal and digital rectal examinations were                            normal. Pertinent negatives include normal prostate                            (size, shape, and consistency).                           Three sessile polyps were found in the transverse  colon and cecum. The polyps were diminutive in                            size. These polyps were removed with a cold biopsy                            forceps. Resection and retrieval were complete.                            Verification of patient identification for the                            specimen was done. Estimated blood loss was minimal.                           Diverticula were found in the sigmoid colon.                           The exam was otherwise without abnormality on                            direct and retroflexion views. Complications:            No immediate complications. Estimated Blood Loss:     Estimated blood loss was minimal. Impression:               - Three diminutive polyps in the transverse colon                            and in the cecum, removed with a cold biopsy                             forceps. Resected and retrieved.                           - Diverticulosis in the sigmoid colon.                           - The examination was otherwise normal on direct                            and retroflexion views.                           - Personal history of colonic polyp 2 mm adenoma                            2014. Recommendation:           - Patient has a contact number available for                            emergencies. The signs and symptoms of potential                            delayed  complications were discussed with the                            patient. Return to normal activities tomorrow.                            Written discharge instructions were provided to the                            patient.                           - Resume previous diet.                           - Continue present medications.                           - Await pathology results.                           - Repeat colonoscopy is recommended. The                            colonoscopy date will be determined after pathology                            results from today's exam become available for                            review. Gatha Mayer, MD 05/25/2020 12:16:48 PM This report has been signed electronically.

## 2020-05-25 NOTE — Progress Notes (Signed)
PT taken to PACU. Monitors in place. VSS. Report given to RN. 

## 2020-05-25 NOTE — Patient Instructions (Addendum)
3 tiny polyps removed today  I will let you know pathology results and when to have another routine colonoscopy by mail and/or My Chart.  I appreciate the opportunity to care for you. Gatha Mayer, MD, [facility]   Please see handouts given to you on Polyps and Diverticulosis.  YOU HAD AN ENDOSCOPIC PROCEDURE TODAY AT [facility]:   Refer to the procedure report that was given to you for any specific questions about what was found during the examination.  If the procedure report does not answer your questions, please call your gastroenterologist to clarify.  If you requested that your care partner not be given the details of your procedure findings, then the procedure report has been included in a sealed envelope for you to review at your convenience later.  YOU SHOULD EXPECT: Some feelings of bloating in the abdomen. Passage of more gas than usual.  Walking can help get rid of the air that was put into your GI tract during the procedure and reduce the bloating. If you had a lower endoscopy (such as a colonoscopy or flexible sigmoidoscopy) you may notice spotting of blood in your stool or on the toilet paper. If you underwent a bowel prep for your procedure, you may not have a normal bowel movement for a few days.  Please Note:  You might notice some irritation and congestion in your nose or some drainage.  This is from the oxygen used during your procedure.  There is no need for concern and it should clear up in a day or so.  SYMPTOMS TO REPORT IMMEDIATELY:   Following lower endoscopy (colonoscopy or flexible sigmoidoscopy):  Excessive amounts of blood in the stool  Significant tenderness or worsening of abdominal pains  Swelling of the abdomen that is new, acute  Fever of 100F or higher   Black, tarry-looking stools  For urgent or emergent issues, a gastroenterologist can be reached at any hour by calling [phone]. Do not use MyChart messaging for urgent concerns.     DIET:  We do recommend a small meal at first, but then you may proceed to your regular diet.  Drink plenty of fluids but you should avoid alcoholic beverages for 24 hours.  ACTIVITY:  You should plan to take it easy for the rest of today and you should NOT DRIVE or use heavy machinery until tomorrow (because of the sedation medicines used during the test).    FOLLOW UP: Our staff will call the number listed on your records 48-72 hours following your procedure to check on you and address any questions or concerns that you may have regarding the information given to you following your procedure. If we do not reach you, we will leave a message.  We will attempt to reach you two times.  During this call, we will ask if you have developed any symptoms of COVID 19. If you develop any symptoms (ie: fever, flu-like symptoms, shortness of breath, cough etc.) before then, please call [phone].  If you test positive for Covid 19 in the 2 weeks post procedure, please call and report this information to [place].    If any biopsies were taken you will be contacted by phone or by letter within the next 1-3 weeks.  Please call us at [phone] if you have not heard about the biopsies in 3 weeks.    SIGNATURES/CONFIDENTIALITY: You and/or your care partner have signed paperwork which will be entered into your electronic medical record.  These signatures attest to the fact that that the information above on your After Visit Summary has been reviewed and is understood.  Full responsibility of the confidentiality of this discharge information lies with you and/or your care-partner.

## 2020-05-25 NOTE — Progress Notes (Signed)
Called to room to assist during endoscopic procedure.  Patient ID and intended procedure confirmed with present staff. Received instructions for my participation in the procedure from the performing physician.  

## 2020-05-27 ENCOUNTER — Telehealth: Payer: Self-pay | Admitting: *Deleted

## 2020-05-27 NOTE — Telephone Encounter (Signed)
  Follow up Call-  Call back number 05/25/2020  Post procedure Call Back phone  # [phone]  Permission to leave phone message Yes  Some recent data might be hidden     Patient questions:  Do you have a fever, pain , or abdominal swelling? No. Pain Score  0 *  Have you tolerated food without any problems? Yes.    Have you been able to return to your normal activities? Yes.    Do you have any questions about your discharge instructions: Diet   No. Medications  No. Follow up visit  No.  Do you have questions or concerns about your Care? No.  Actions: * If pain score is 4 or above: No action needed, pain <4.  1. Have you developed a fever since your procedure? no  2.   Have you had an respiratory symptoms (SOB or cough) since your procedure? no  3.   Have you tested positive for COVID 19 since your procedure no  4.   Have you had any family members/close contacts diagnosed with the COVID 19 since your procedure?  no   If yes to any of these questions please route to Joylene John, RN and Joella Prince, RN

## 2020-06-02 ENCOUNTER — Telehealth: Payer: Self-pay | Admitting: *Deleted

## 2020-06-02 ENCOUNTER — Encounter: Payer: PPO | Admitting: Internal Medicine

## 2020-06-02 NOTE — Telephone Encounter (Signed)
Pt did not come to his appt this am. I called pt to re-schedule - no answer.

## 2020-06-02 NOTE — Progress Notes (Deleted)
Subjective:   Patient ID: Scott Burgess male   DOB: 03/20/1950 70 y.o.   MRN: [id number]  HPI: [name] is a 70 y.o. male with a history of hypertension, peripheral artery disease, vitamin C deficiency, osteoarthritis, and a prior colonic adenoma who presents for a routine follow up.    HTN - olmesartan-amlodipine-HCTZ 20-5-12.5    PAD -rosuvastatin 20 mg   Knee OA -steroid injections 3 months ago   Insomnia -zolpidem   Vitamin D deficiency  - cholecalciferol  - check today   History of colonic adenoma - colonoscopy last week, found 3 polyps. Waiting for pathology report.    Please see problem based charting for more details.    Past Medical History:  Diagnosis Date  . Hepatitis C, chronic (HCC)    genotype 2  . Hernia   . History of hepatitis C 01/22/2012   [place] w/ elastography shows F2/F3 Completed 8 weeks Mayvert on 04/29/17. 12 weeks sustained viral response was undetectable.  Will need ongoing screening for [place] recommended by [place] q 1 year, patient requests longer interval due to cost.    . Hyperlipidemia   . Hypertension   . Osteoarthritis of right knee 02/11/2014  . Personal history of colonic adenoma 03/17/2012  . Pneumonia   . STD (male)    Current Outpatient Medications  Medication Sig Dispense Refill  . Cholecalciferol (VITAMIN D3) 1000 UNITS CAPS Take 1 capsule by mouth daily. Reported on 08/04/2015 (Patient not taking: Reported on 05/25/2020)    . Olmesartan-amLODIPine-HCTZ 20-5-12.5 MG TABS TAKE 1 TABLET BY MOUTH DAILY. 30 tablet 11  . rosuvastatin (CRESTOR) 20 MG tablet TAKE 1 TABLET (20 MG TOTAL) BY MOUTH DAILY. (Patient not taking: Reported on 05/25/2020) 30 tablet 11  . sildenafil (REVATIO) 20 MG tablet Take 2-5 tablets as needed for sexual activity. (Patient not taking: Reported on 05/25/2020) 50 tablet 5  . zolpidem (AMBIEN) 5 MG tablet TAKE 1 TABLET BY MOUTH BEFORE BEDTIME AS NEEDED 30 tablet 5   No current facility-administered  medications for this visit.   Family History  Problem Relation Age of Onset  . Diabetes Mother   . Diabetes Maternal Grandmother   . Colon cancer Neg Hx   . Colon polyps Neg Hx   . Esophageal cancer Neg Hx   . Rectal cancer Neg Hx   . Stomach cancer Neg Hx    Social History   Socioeconomic History  . Marital status: Single    Spouse name: Not on file  . Number of children: 3  . Years of education: Not on file  . Highest education level: Not on file  Occupational History  . Occupation: Airline pilot: [employer]  Tobacco Use  . Smoking status: Former Smoker    Quit date: 01/15/2005    Years since quitting: 15.3  . Smokeless tobacco: Never Used  Vaping Use  . Vaping Use: Never used  Substance and Sexual Activity  . Alcohol use: No    Alcohol/week: 0.0 standard drinks  . Drug use: No  . Sexual activity: Not on file  Other Topics Concern  . Not on file  Social History Narrative  . Not on file   Social Determinants of Health   Financial Resource Strain: Not on file  Food Insecurity: Not on file  Transportation Needs: Not on file  Physical Activity: Not on file  Stress: Not on file  Social Connections: Not on file   Review of Systems: {Review Of Systems:30496}  Objective:  Physical Exam: There were no vitals filed for this visit.  General: Well appearing and in no acute distress, appears stated age Neuro: A&O x4, normal affect HEENT: Normocephalic, atraumatic, PERRLA, EOMI, normal sclerae without scleral icterus or injection. Moist mucous membranes. Clear oropharynx Cardiovascular: RRR, no m/r/g. Normal S1 and S2 without S3 or S4. Pulses 2+ in bilateral UE and LE. No peripheral edema Pulmonary: CTAB, no wheezes, rhonchi or rales. Normal WOB, no clubbing  Abdominal: Abdomen soft and non-distended. Normal bowel sounds in all four quadrants. No tenderness to palpation, guarding, or rebound tenderness Skin: Warm and dry with no rashes, cuts, or bruises MSK:  Normal ROM of all extremities. Strength 5/5 in bilateral UE and LE   Assessment & Plan:  Please see problem based charting for more details.

## 2020-06-08 ENCOUNTER — Encounter: Payer: Self-pay | Admitting: Internal Medicine

## 2020-06-08 DIAGNOSIS — Z86010 Personal history of colonic polyps: Secondary | ICD-10-CM

## 2020-06-10 ENCOUNTER — Encounter: Payer: Self-pay | Admitting: Internal Medicine

## 2020-06-10 DIAGNOSIS — K5730 Diverticulosis of large intestine without perforation or abscess without bleeding: Secondary | ICD-10-CM | POA: Insufficient documentation

## 2020-07-20 ENCOUNTER — Other Ambulatory Visit (HOSPITAL_COMMUNITY): Payer: Self-pay

## 2020-07-20 MED FILL — Zolpidem Tartrate Tab 5 MG: ORAL | 30 days supply | Qty: 30 | Fill #0 | Status: AC

## 2020-07-20 MED FILL — Olmesartan-Amlodipine-Hydrochlorothiazide Tab 20-5-12.5 MG: ORAL | 30 days supply | Qty: 30 | Fill #0 | Status: AC

## 2020-08-30 ENCOUNTER — Ambulatory Visit (HOSPITAL_COMMUNITY)
Admission: RE | Admit: 2020-08-30 | Discharge: 2020-08-30 | Disposition: A | Discharge status: Home / Self Care | Payer: PPO | Source: Ambulatory Visit | Attending: Student in an Organized Health Care Education/Training Program | Admitting: Student in an Organized Health Care Education/Training Program

## 2020-08-30 ENCOUNTER — Encounter: Payer: Self-pay | Admitting: Internal Medicine

## 2020-08-30 ENCOUNTER — Ambulatory Visit (INDEPENDENT_AMBULATORY_CARE_PROVIDER_SITE_OTHER): Payer: PPO | Admitting: Internal Medicine

## 2020-08-30 ENCOUNTER — Other Ambulatory Visit: Payer: Self-pay

## 2020-08-30 VITALS — BP 150/81 | HR 85 | Temp 98.2°F | Resp 24 | Ht 70.0 in | Wt 220.3 lb

## 2020-08-30 DIAGNOSIS — M170 Bilateral primary osteoarthritis of knee: Secondary | ICD-10-CM

## 2020-08-30 DIAGNOSIS — M1712 Unilateral primary osteoarthritis, left knee: Secondary | ICD-10-CM | POA: Diagnosis not present

## 2020-08-30 DIAGNOSIS — M1711 Unilateral primary osteoarthritis, right knee: Secondary | ICD-10-CM | POA: Diagnosis not present

## 2020-08-30 NOTE — Progress Notes (Signed)
   CC: osteoarthritis of both knees   HPI:Mr.Arnulfo Caffrey is a 70 y.o. male who presents for evaluation of osteoarthritis of both knees. Please see individual problem based A/P for details.  Past Medical History:  Diagnosis Date   Hepatitis C, chronic (HCC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 12 weeks sustained viral response was undetectable.  Will need ongoing screening for [facility] recommended by [place] q 1 year, patient requests longer interval due to cost.     Hyperlipidemia    Hypertension    Osteoarthritis of right knee 02/11/2014   Personal history of colonic adenoma 03/17/2012   Pneumonia    STD (male)    Review of Systems:   Review of Systems  Constitutional:  Negative for chills and fever.  Musculoskeletal:  Positive for joint pain. Negative for falls.  Psychiatric/Behavioral:  Negative for substance abuse. The patient has insomnia (occasional , takes [place] when needed).     Physical Exam: Vitals:   08/30/20 1502  BP: (!) 150/81  Pulse: 85  Resp: (!) 24  Temp: 98.2 F (36.8 C)  TempSrc: Oral  SpO2: 97%  Weight: 220 lb 4.8 oz (99.9 kg)  Height: '5\' 10"'$  (1.778 m)   General: NAD, obese HEENT: Conjunctiva nl , antiicteric sclerae, moist mucous membranes, no exudate or erythema Cardiovascular: Normal rate, regular rhythm.  No murmurs, rubs, or gallops Pulmonary : Equal breath sounds, No wheezes, rales, or rhonchi Abdominal: soft, nontender,  bowel sounds present Knee: - Inspection: No gross deformity. No effusion. No erythema or bruising. Skin intact - Palpation: no TTP - ROM: full active ROM with flexion and extension in knee, crepitus with pain noted - Strength: 5/5 strength - Neuro/vasc: NV intact    Assessment & Plan:   See Encounters Tab for problem based charting.  Patient discussed with Dr. Evette Doffing

## 2020-08-30 NOTE — Patient Instructions (Signed)
Thank you for trusting me with your care. To recap, today we discussed the following:   1. Osteoarthritis of both knees, unspecified osteoarthritis type - DG Knee Complete 4 Views Right; Future - DG Knee Complete 4 Views Left; Future

## 2020-08-31 ENCOUNTER — Encounter: Payer: Self-pay | Admitting: Internal Medicine

## 2020-08-31 NOTE — Progress Notes (Signed)
Knee Injection Procedure Note  Diagnosis: bilateral knee  Indications: osteoarthritis  Procedure Details   Consent was obtained for the procedure. The joint was prepped with Betadine. A 20 gauge needle was inserted into the inferior medial approach on the right knee and a separate 22 gauge needle used for inferior lateral approach on the left knee.  2 ml 1% lidocaine and 1 ml of Triamcinolone was injected into each joint.The needle was removed and the area cleansed and dressed after each injection.  Complications:  None; patient tolerated the procedure well.

## 2020-08-31 NOTE — Assessment & Plan Note (Signed)
Patient presenting for evaluation and treatment of osteoarthritis of knees. He found releif of pain for approximately 6 months since his last injections. Tolerated injections well today. Patient notes he is getting older and he would like to pursue other intevention while he thinks he can tolerate them. He ask about hyaluronic knee injections, but I can not recommend these given current evidence. We discussed obtaining knee xrays today and possible  referral to orthopedic surgery based on imaging.   Addendum. Osteoarthritis of bilateral knees confirmed on xray. Moderate osteoarthritis of right knee and early osteoarthritis of left knee. Patient updated and knee pain has improved with recent injections. He would like referral to orthopedic surgery to discuss knee replacement.

## 2020-09-01 NOTE — Progress Notes (Signed)
Internal Medicine Clinic Attending  I saw and evaluated the patient.  I personally confirmed the key portions of the history and exam documented by Dr. Court Joy and I reviewed pertinent patient test results.  The assessment, diagnosis, and plan were formulated together and I agree with the documentation in the resident's note. I was present for the entirety of the procedures.

## 2020-09-05 ENCOUNTER — Other Ambulatory Visit (HOSPITAL_COMMUNITY): Payer: Self-pay

## 2020-09-05 ENCOUNTER — Other Ambulatory Visit: Payer: Self-pay | Admitting: Physician Assistant

## 2020-09-05 ENCOUNTER — Ambulatory Visit: Payer: PPO | Admitting: Physician Assistant

## 2020-09-05 ENCOUNTER — Other Ambulatory Visit: Payer: Self-pay

## 2020-09-05 ENCOUNTER — Encounter: Payer: Self-pay | Admitting: Orthopedic Surgery

## 2020-09-05 VITALS — Ht 70.0 in | Wt 220.0 lb

## 2020-09-05 DIAGNOSIS — M170 Bilateral primary osteoarthritis of knee: Secondary | ICD-10-CM

## 2020-09-05 MED ORDER — MELOXICAM 7.5 MG PO TABS
7.5000 mg | ORAL_TABLET | Freq: Every day | ORAL | 0 refills | Status: DC
  Filled 2020-09-05: qty 30, 30d supply, fill #0

## 2020-09-05 NOTE — Progress Notes (Signed)
Office Visit Note   Patient: Scott Burgess           Date of Birth: Sep 23, 1950           MRN: [id number] Visit Date: 09/05/2020              Requested by: Axel Filler, MD [address] [place],  [place] [address] PCP: Lucious Groves, DO  Chief Complaint  Patient presents with   Right Knee - Pain   Left Knee - Pain      HPI: Patient is a pleasant 70 year old gentleman who presents today with approximate 2-year history of bilateral knee pain right greater than left.  He denies any injuries though he says as a child he had corrective surgery to make his legs less not need to "he has been getting injections into his knees by his primary care physician.  The last being a week ago.  They do seem to help.  He is however finding that the right knee injections recently did not alleviate his symptoms as much.  On the right knee he has more pain on the lateral side on the left knee on the medial side.  He did take 800 mg of ibuprofen but has since weaned off this.  He did think it helped.  He has also used a topical medication.  He denies any instability.  Assessment & Plan: Visit Diagnoses: No diagnosis found.  Plan: Discussed the natural history of this he has right greater than left arthritis.  We will go forward with authorization for viscosupplementation.  I also gave him some Mobic and asked that he not take any other anti-inflammatories this and take it only when he is having difficulties.  Also gave him information about Voltaren gel.  We talked about close chain strengthening exercises.  He will follow-up for his injections  Follow-Up Instructions: No follow-ups on file.   Ortho Exam  Patient is alert, oriented, no adenopathy, well-dressed, normal affect, normal respiratory effort. On the right knee he has varus malalignment no effusion no redness no cellulitis he does have grinding with range of motion.  Most of his pain interestingly is over the lateral joint line.   On the left knee has no effusion good no redness.  Mild varus malalignment tender over the medial joint line positive patellar grind he has good varus valgus stability on both knees and good endpoint on anterior drawer x-rays reviewed today demonstrate arthritic changes most notably in the medial compartments right greater than left knee  Imaging: No results found. No images are attached to the encounter.  Labs: Lab Results  Component Value Date   HGBA1C 5.5 07/29/2017   HGBA1C  12/09/2008    5.5 (NOTE) The ADA recommends the following therapeutic goal for glycemic control related to Hgb A1c measurement: Goal of therapy: <6.5 Hgb A1c  Reference: American Diabetes Association: Clinical Practice Recommendations 2010, Diabetes Care, 2010, 33: (Suppl  1).     Lab Results  Component Value Date   ALBUMIN 4.9 (H) 04/16/2019   ALBUMIN 5.0 (H) 08/14/2018   ALBUMIN 4.4 07/29/2017    No results found for: MG Lab Results  Component Value Date   VD25OH 27.7 (L) 08/14/2018   VD25OH 26.6 (L) 07/29/2017   VD25OH 13.7 (L) 01/31/2017    No results found for: PREALBUMIN CBC EXTENDED Latest Ref Rng & Units 04/16/2019 01/31/2017 07/08/2012  WBC 3.4 - 10.8 x10E3/uL 5.6 4.2 12.4(H)  RBC 4.14 - 5.80 x10E6/uL  4.87 4.62 4.76  HGB 13.0 - 17.7 g/dL 14.9 14.7 15.6  HCT 37.5 - 51.0 % 44.6 44.1 44.4  PLT 150 - 450 x10E3/uL 299 246 181  NEUTROABS 1.4 - 7.0 x10E3/uL 3.1 1.6 10.2(H)  LYMPHSABS 0.7 - 3.1 x10E3/uL 1.9 2.0 1.4     Body mass index is 31.57 kg/m.  Orders:  No orders of the defined types were placed in this encounter.  Meds ordered this encounter  Medications   meloxicam (MOBIC) 7.5 MG tablet    Sig: Take 1 tablet (7.5 mg total) by mouth daily.    Dispense:  30 tablet    Refill:  0     Procedures: No procedures performed  Clinical Data: No additional findings.  ROS:  All other systems negative, except as noted in the HPI. Review of Systems  Objective: Vital Signs: Ht '5\' 10"'$   (1.778 m)   Wt 220 lb (99.8 kg)   BMI 31.57 kg/m   Specialty Comments:  No specialty comments available.  PMFS History: Patient Active Problem List   Diagnosis Date Noted   Sigmoid diverticulosis 06/10/2020   PAD (peripheral artery disease) ([place]) 08/19/2018   Low back pain 03/28/2018   Poor dentition 06/06/2017   Vitamin D deficiency 08/30/2016   Bilateral cataracts 08/04/2015   Asymptomatic PVCs 12/17/2014   Osteoarthritis of knees, bilateral 02/11/2014   Nearsightedness 03/19/2013   Insomnia 05/14/2012   Personal history of colonic adenoma 03/17/2012   History of hepatitis C 01/22/2012   Erectile dysfunction 01/22/2012   History of syphilis 12/21/2011   Routine health maintenance 12/21/2011   Candidate for statin therapy due to risk of future cardiovascular event 12/23/2008   Essential hypertension 12/23/2008   Past Medical History:  Diagnosis Date   Hepatitis C, chronic (HCC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 12 weeks sustained viral response was undetectable.  Will need ongoing screening for [place] recommended by U/S q 1 year, patient requests longer interval due to cost.     Hyperlipidemia    Hypertension    Osteoarthritis of right knee 02/11/2014   Personal history of colonic adenoma 03/17/2012   Pneumonia    STD (male)     Family History  Problem Relation Age of Onset   Diabetes Mother    Diabetes Maternal Grandmother    Colon cancer Neg Hx    Colon polyps Neg Hx    Esophageal cancer Neg Hx    Rectal cancer Neg Hx    Stomach cancer Neg Hx     Past Surgical History:  Procedure Laterality Date   COLONOSCOPY  03/10/12   [facility]   HERNIA REPAIR     TONSILLECTOMY     Social History   Occupational History   Occupation: Airline pilot: [employer]  Tobacco Use   Smoking status: Former    Types: Cigarettes    Quit date: 01/15/2005    Years since quitting: 15.6    Smokeless tobacco: Never  Vaping Use   Vaping Use: Never used  Substance and Sexual Activity   Alcohol use: No    Alcohol/week: 0.0 standard drinks   Drug use: No   Sexual activity: Not on file

## 2020-09-06 ENCOUNTER — Telehealth: Payer: Self-pay

## 2020-09-06 NOTE — Telephone Encounter (Signed)
-----   Message from Pamella Pert, [place] sent at 09/05/2020  2:31 PM EDT ----- Regarding: gel injections Bilateral knee gel injections for OA

## 2020-09-06 NOTE — Telephone Encounter (Signed)
Noted  

## 2020-10-11 ENCOUNTER — Ambulatory Visit (INDEPENDENT_AMBULATORY_CARE_PROVIDER_SITE_OTHER): Payer: PPO | Admitting: *Deleted

## 2020-10-11 DIAGNOSIS — Z23 Encounter for immunization: Secondary | ICD-10-CM

## 2020-10-20 ENCOUNTER — Telehealth: Payer: Self-pay

## 2020-10-20 NOTE — Telephone Encounter (Signed)
VOB has been submitted for Monovisc, bilateral knee. Pending BV.

## 2020-10-21 ENCOUNTER — Telehealth: Payer: Self-pay

## 2020-10-21 NOTE — Telephone Encounter (Signed)
Called and left a VM for patient to CB to schedule for gel injection with Dr. Sharol Given or Junie Panning.  Approved for Monovisc, bilateral knee. [place] Patient will be responsible for 20% OOP. Co-pay of $30.00 No PA required

## 2021-09-24 IMAGING — DX DG KNEE COMPLETE 4+V*R*
4 series · 4 of 4 positions shown · non-contrast
Comparison: None.

CLINICAL DATA: Osteoarthritis of both knees

EXAM:
RIGHT KNEE - COMPLETE 4+ VIEW

[knee ap (1 of 2)]
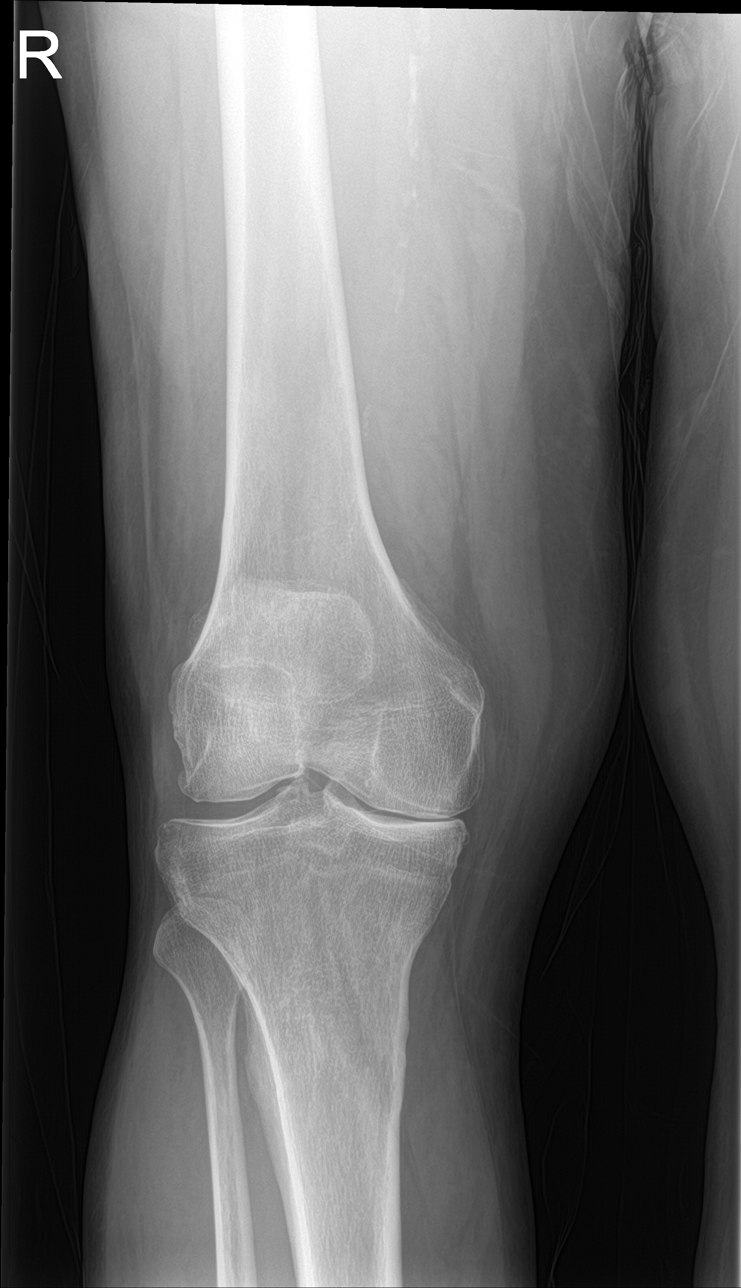

[knee lat]
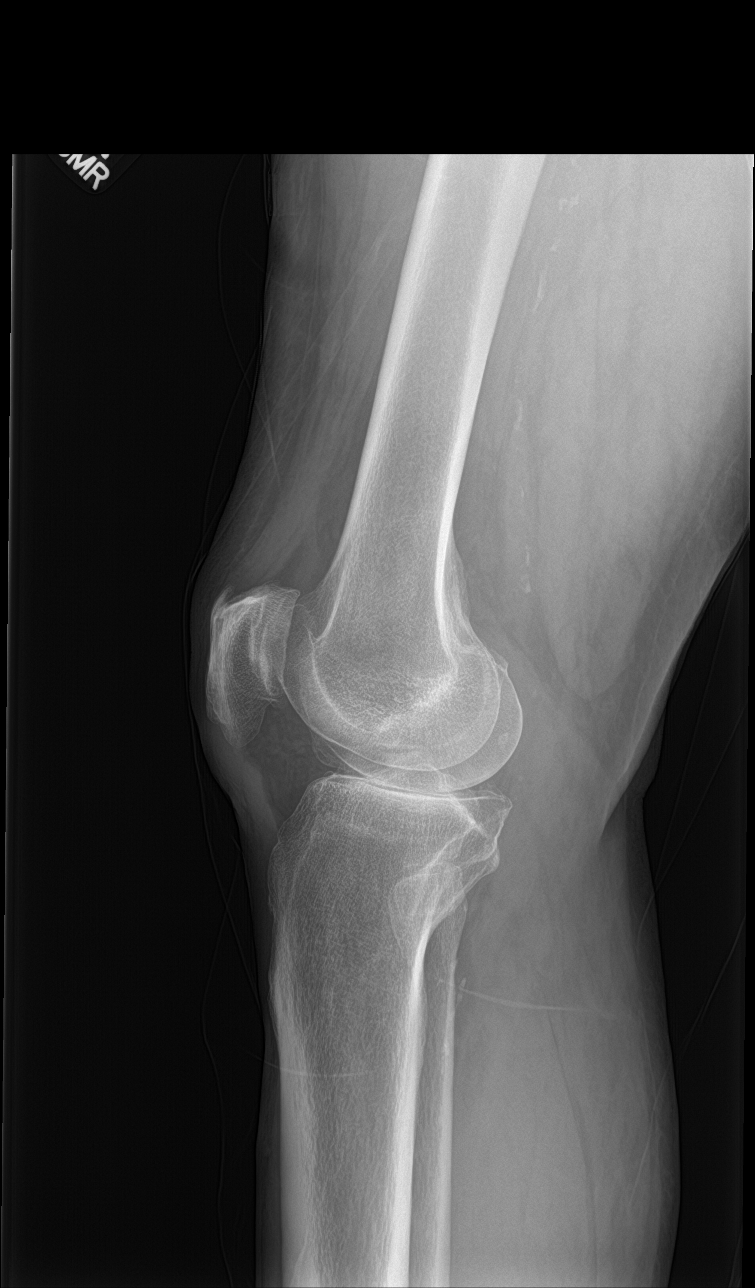

[knee sunrise]
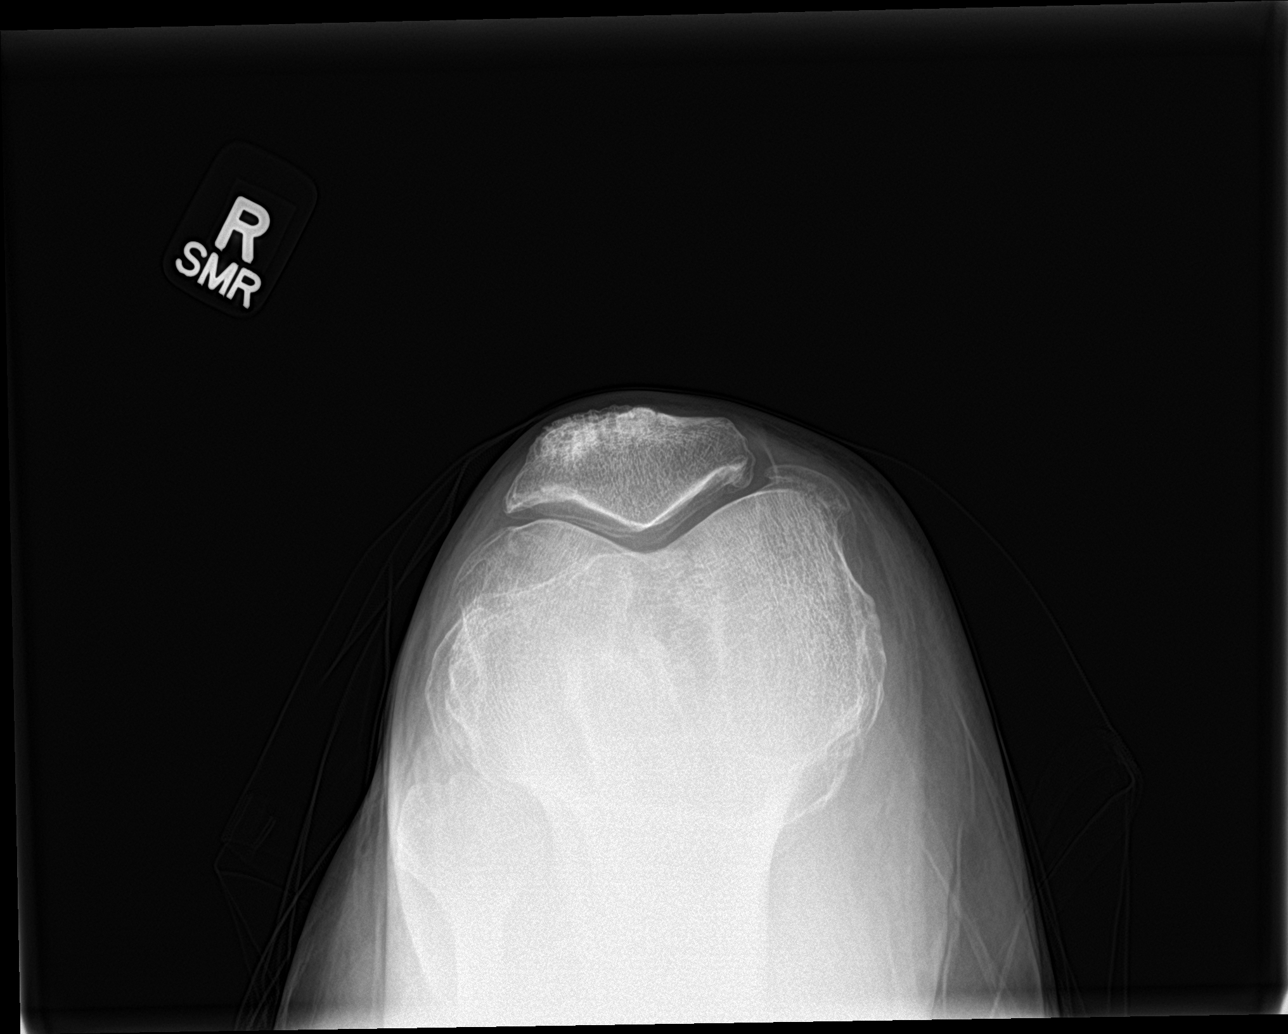

[knee ap (2 of 2)]
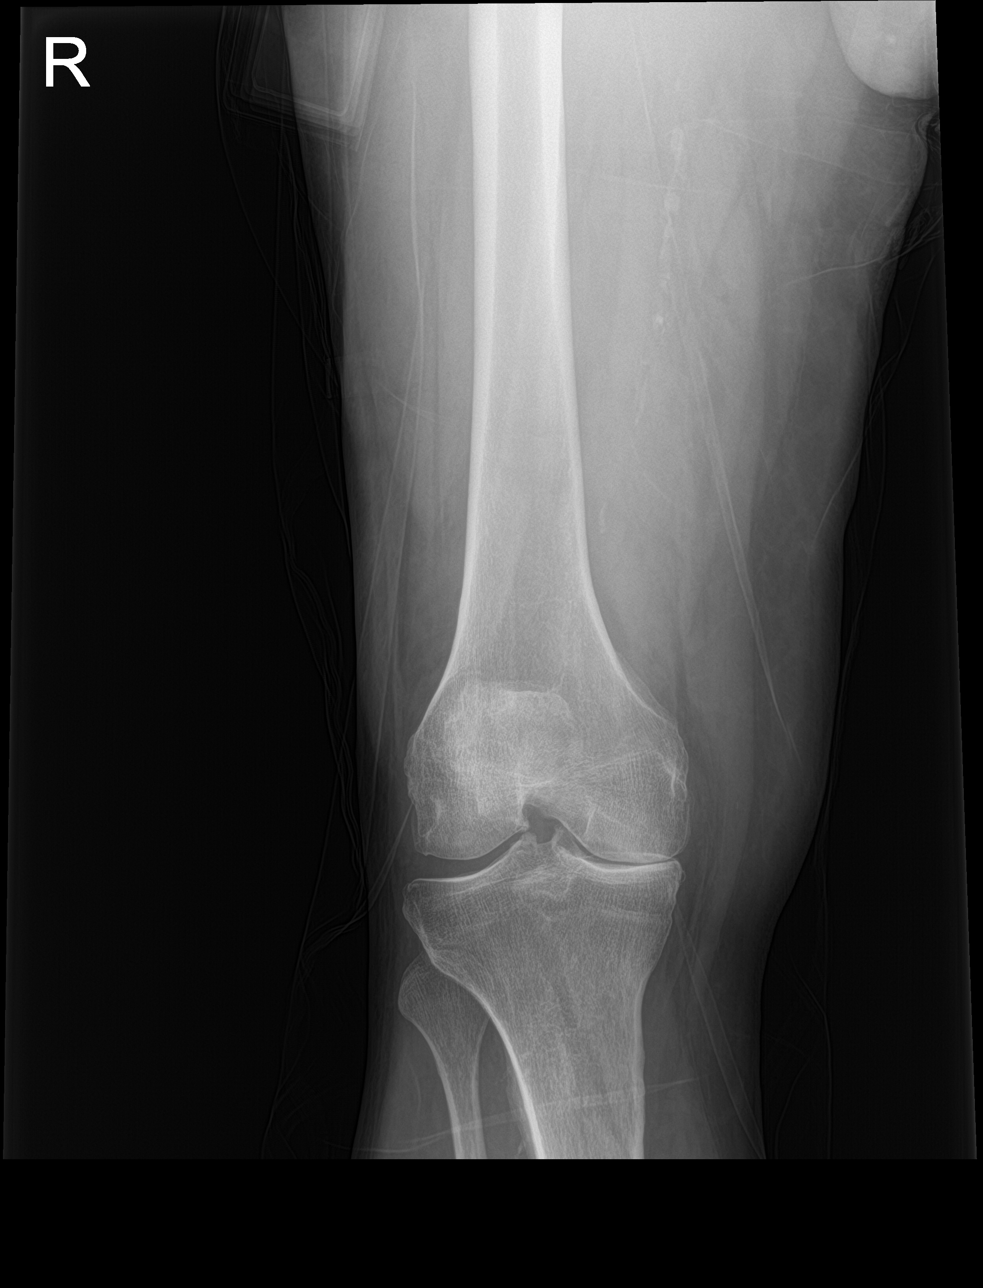

[4 of 4 positions shown; findings below may reference images not displayed]

FINDINGS: Degenerative changes with joint space narrowing and spurring, most
pronounced in the medial compartment. No joint effusion. No acute
bony abnormality. Specifically, no fracture, subluxation, or
dislocation.
IMPRESSION: Moderate osteoarthritis, most pronounced in the medial compartment.

## 2021-09-24 IMAGING — DX DG KNEE COMPLETE 4+V*L*
4 series · 4 of 4 positions shown · non-contrast
Comparison: None.

CLINICAL DATA: Osteoarthritis of both knees

EXAM:
LEFT KNEE - COMPLETE 4+ VIEW

[knee ap (1 of 2)]
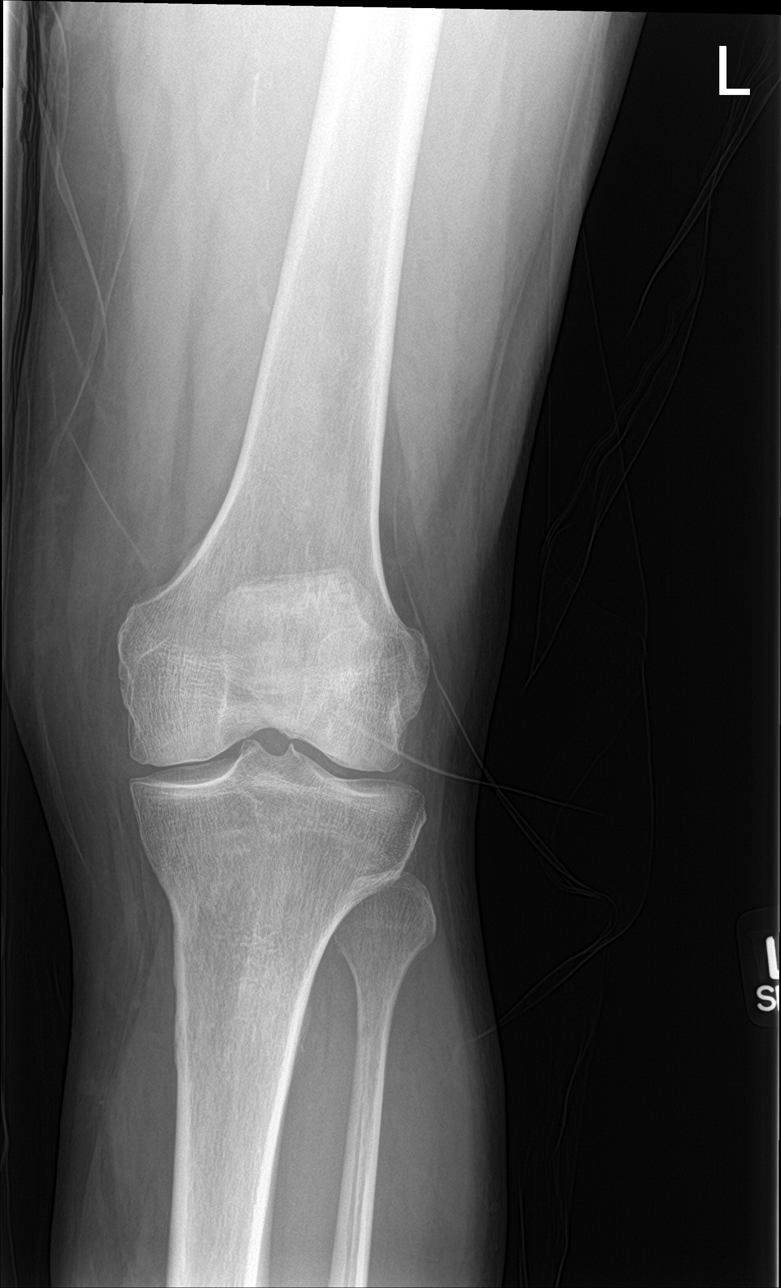

[knee lat]
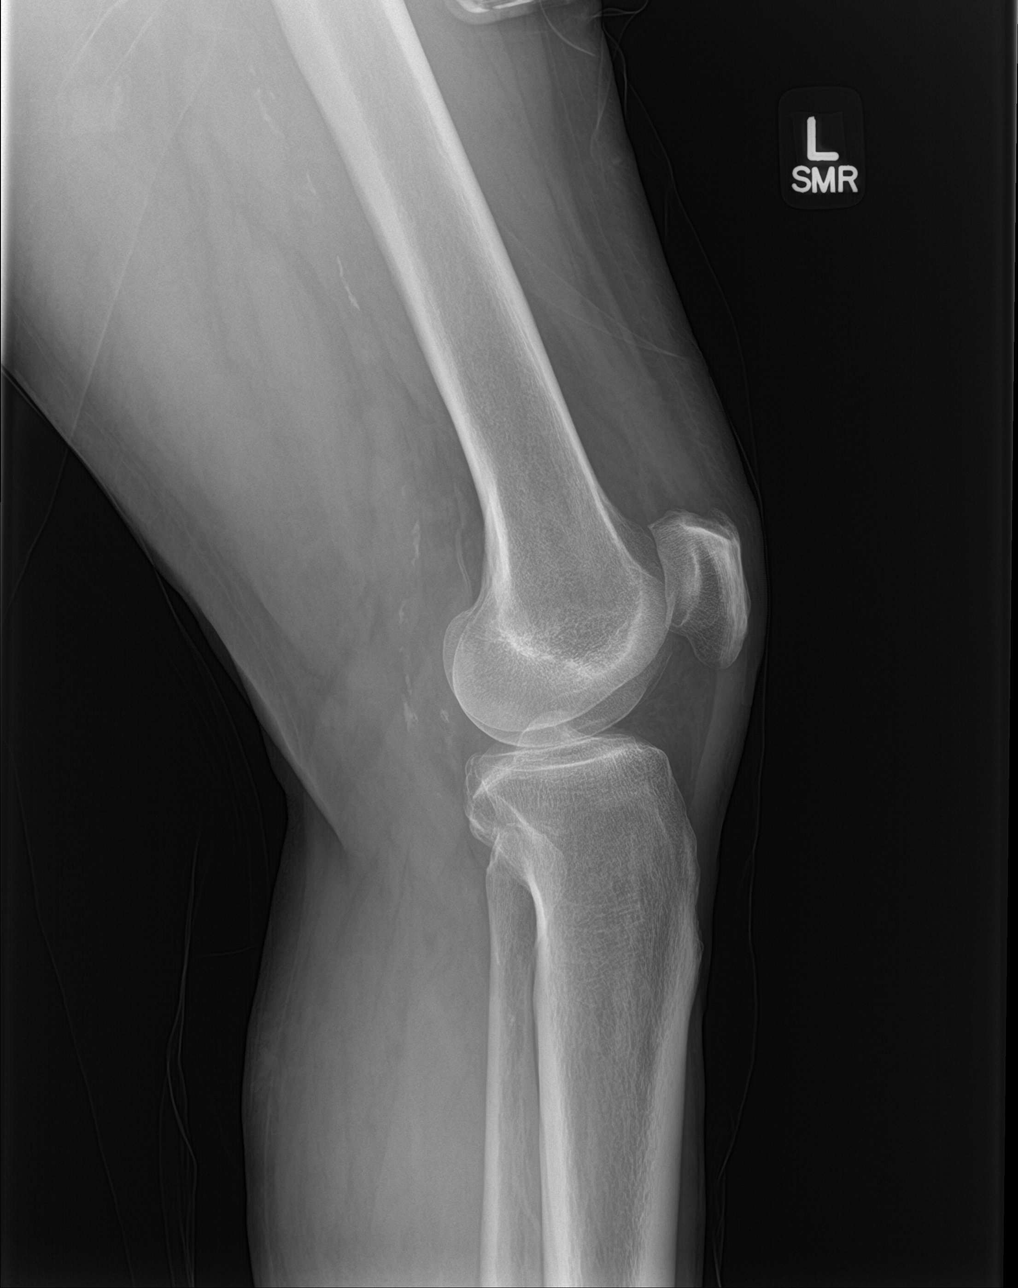

[knee sunrise]
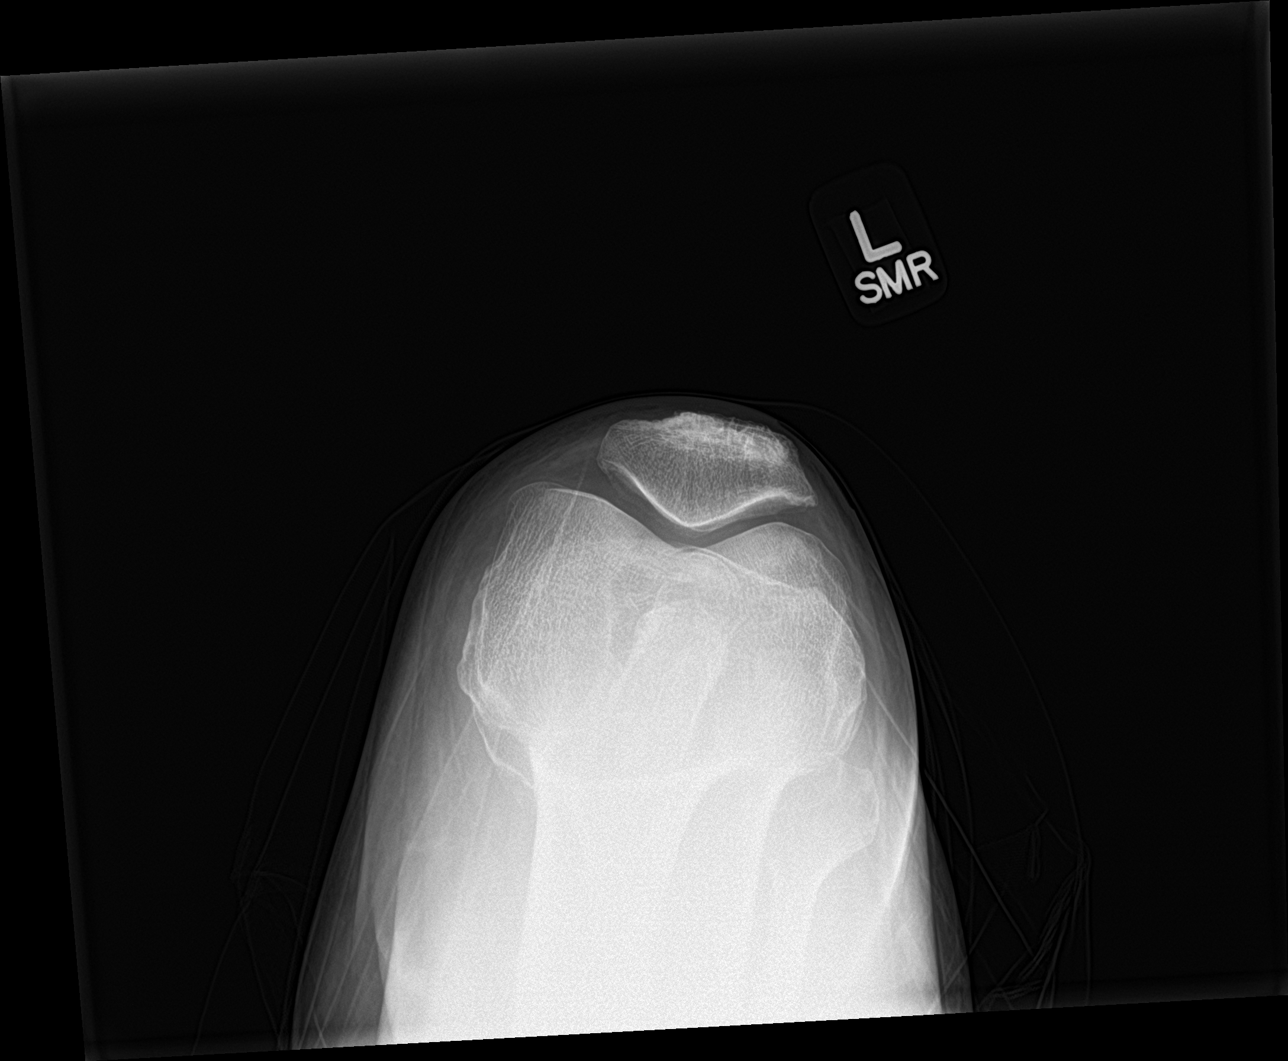

[knee ap (2 of 2)]
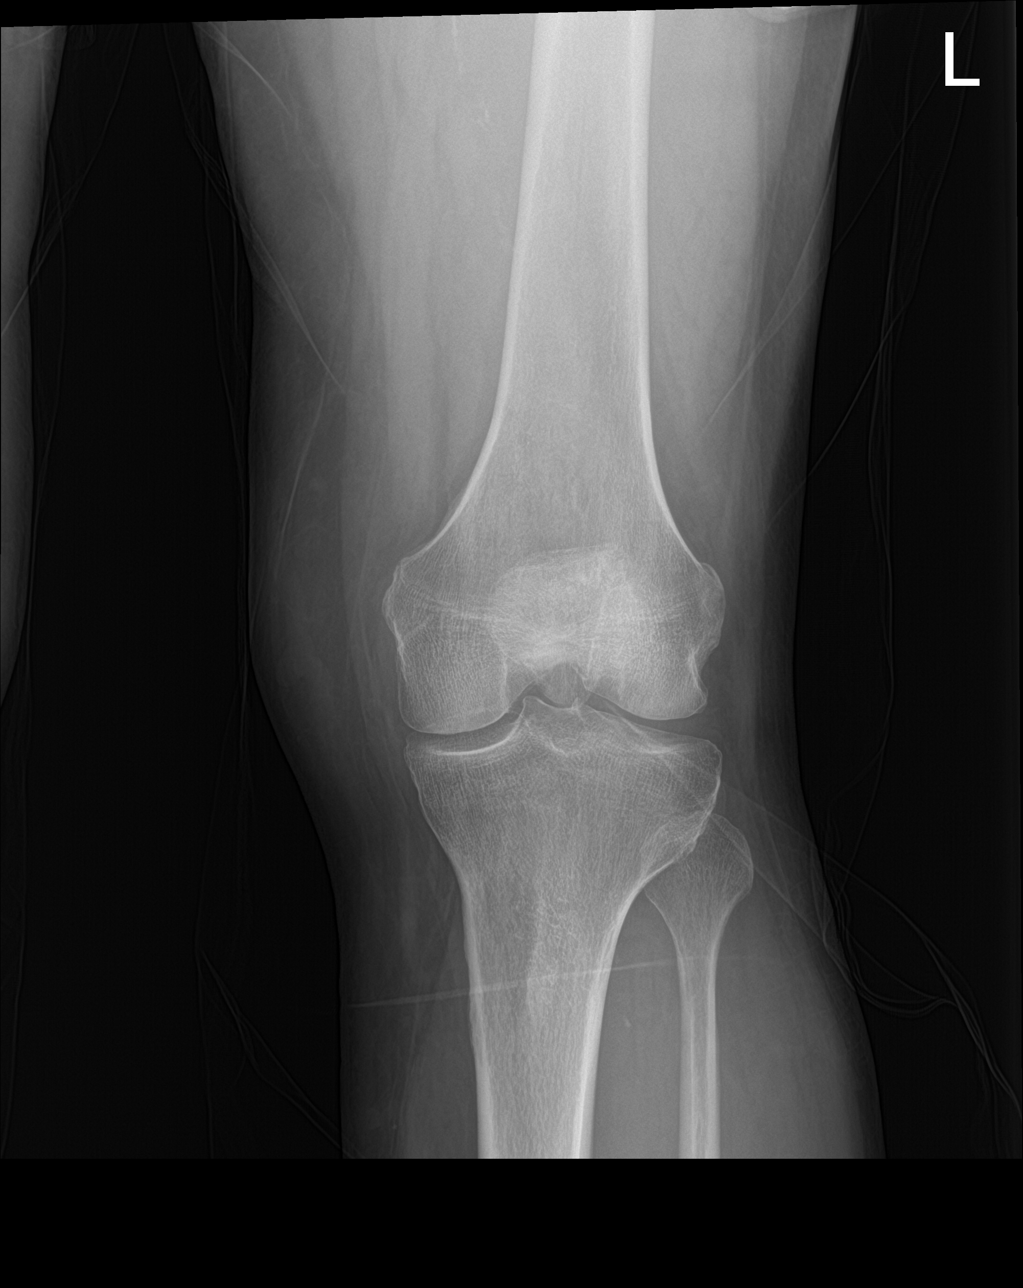

[4 of 4 positions shown; findings below may reference images not displayed]

FINDINGS: Slight joint space narrowing and early spurring in the left knee
joint. No joint effusion. No acute bony abnormality. Specifically,
no fracture, subluxation, or dislocation.
IMPRESSION: Early osteoarthritis.  No acute bony abnormality.

## 2021-11-21 ENCOUNTER — Other Ambulatory Visit: Payer: Self-pay

## 2021-11-21 ENCOUNTER — Encounter: Payer: Self-pay | Admitting: Student

## 2021-11-21 ENCOUNTER — Ambulatory Visit (INDEPENDENT_AMBULATORY_CARE_PROVIDER_SITE_OTHER): Payer: PPO | Admitting: Student

## 2021-11-21 ENCOUNTER — Other Ambulatory Visit (HOSPITAL_COMMUNITY): Payer: Self-pay

## 2021-11-21 ENCOUNTER — Ambulatory Visit (INDEPENDENT_AMBULATORY_CARE_PROVIDER_SITE_OTHER): Payer: PPO

## 2021-11-21 VITALS — BP 171/82 | HR 84 | Temp 97.7°F | Ht 70.0 in | Wt 223.9 lb

## 2021-11-21 DIAGNOSIS — Z87891 Personal history of nicotine dependence: Secondary | ICD-10-CM

## 2021-11-21 DIAGNOSIS — I10 Essential (primary) hypertension: Secondary | ICD-10-CM

## 2021-11-21 DIAGNOSIS — Z9189 Other specified personal risk factors, not elsewhere classified: Secondary | ICD-10-CM

## 2021-11-21 DIAGNOSIS — Z0000 Encounter for general adult medical examination without abnormal findings: Secondary | ICD-10-CM

## 2021-11-21 DIAGNOSIS — I739 Peripheral vascular disease, unspecified: Secondary | ICD-10-CM

## 2021-11-21 DIAGNOSIS — M170 Bilateral primary osteoarthritis of knee: Secondary | ICD-10-CM

## 2021-11-21 DIAGNOSIS — Z23 Encounter for immunization: Secondary | ICD-10-CM | POA: Diagnosis not present

## 2021-11-21 DIAGNOSIS — F5101 Primary insomnia: Secondary | ICD-10-CM | POA: Diagnosis not present

## 2021-11-21 MED ORDER — TRIAMCINOLONE ACETONIDE 40 MG/ML IJ SUSP: 40.0000 mg | Freq: Once | INTRAMUSCULAR | Status: DC

## 2021-11-21 MED ORDER — ZOLPIDEM TARTRATE 5 MG PO TABS
5.0000 mg | ORAL_TABLET | Freq: Every evening | ORAL | 5 refills | Status: DC | PRN
  Filled 2021-11-21: qty 30, 30d supply, fill #0
  Filled 2021-12-20: qty 30, 30d supply, fill #1
  Filled 2022-01-18: qty 30, 30d supply, fill #2
  Filled 2022-02-19: qty 30, 30d supply, fill #3

## 2021-11-21 MED ORDER — AMLODIPINE-VALSARTAN-HCTZ 5-160-12.5 MG PO TABS
1.0000 | ORAL_TABLET | Freq: Every day | ORAL | 11 refills | Status: DC
  Filled 2021-11-21: qty 30, 30d supply, fill #0

## 2021-11-21 MED ORDER — LIDOCAINE HCL (PF) 1 % IJ SOLN
2.0000 mL | Freq: Once | INTRAMUSCULAR | Status: DC
Start: 2021-11-21 — End: 2022-02-22

## 2021-11-21 MED ORDER — TRIAMCINOLONE ACETONIDE 40 MG/ML IJ SUSP
40.0000 mg | Freq: Once | INTRAMUSCULAR | Status: DC
Start: 2021-11-21 — End: 2022-02-22

## 2021-11-21 MED ORDER — HYDROCHLOROTHIAZIDE 12.5 MG PO CAPS
12.5000 mg | ORAL_CAPSULE | Freq: Every day | ORAL | 11 refills | Status: DC
  Filled 2021-11-21: qty 30, 30d supply, fill #0
  Filled 2021-12-20: qty 30, 30d supply, fill #1
  Filled 2022-01-18: qty 30, 30d supply, fill #2

## 2021-11-21 MED ORDER — OLMESARTAN-AMLODIPINE-HCTZ 20-5-12.5 MG PO TABS
1.0000 | ORAL_TABLET | Freq: Every day | ORAL | 11 refills | Status: DC
  Filled 2021-11-21: qty 30, 30d supply, fill #0

## 2021-11-21 MED ORDER — AMLODIPINE-OLMESARTAN 5-20 MG PO TABS
1.0000 | ORAL_TABLET | Freq: Every day | ORAL | 11 refills | Status: DC
  Filled 2021-11-21: qty 30, 30d supply, fill #0
  Filled 2021-12-20: qty 30, 30d supply, fill #1
  Filled 2022-01-18: qty 30, 30d supply, fill #2

## 2021-11-21 MED ORDER — ROSUVASTATIN CALCIUM 20 MG PO TABS
20.0000 mg | ORAL_TABLET | Freq: Every day | ORAL | 11 refills | Status: DC
  Filled 2021-11-21: qty 30, 30d supply, fill #0
  Filled 2021-12-20: qty 30, 30d supply, fill #1

## 2021-11-21 MED ORDER — LIDOCAINE HCL (PF) 1 % IJ SOLN: 2.0000 mL | Freq: Once | INTRAMUSCULAR | Status: DC

## 2021-11-21 NOTE — Assessment & Plan Note (Signed)
Patient with history of insomnia that is well controlled with Ambien 5 mg as needed. He tried melatonin before and it sometimes helps. He does watch the nightly news in bed. Difficulty falling asleep but able to sleep about 6-7 hours most nights.   Plan -refilled Ambien 5 mg nightly

## 2021-11-21 NOTE — Assessment & Plan Note (Addendum)
BP: (!) 171/82   Repeat BP remains elevated today. Denies any symptoms of headache, vision changes, dyspnea, chest pain or n/v. He has not been taking his antihypertensives for over a year. Not because of side effects but wanted to see if he could tolerate being off of them.  Discussed importance of blood pressure management on overall health. He is agreeable to restarting his medications. Will refill medications today.   Plan -Restart olmesartan-amlodipine 20-5 mg daily and HCTZ 12.5 mg daily (note: triple combination pill not on his formulary) -BMP today, will need repeat BMP at next visit since restarting medications

## 2021-11-21 NOTE — Assessment & Plan Note (Signed)
Patient on aspirin 81 mg daily but has not been taking Crestor 20 mg. Currently asymptomatic. Discussed restarting Crestor and repeating lipid panel which the patient is agreeable to.   Plan -lipid panel today -refilled Crestor 20 mg daily

## 2021-11-21 NOTE — Assessment & Plan Note (Signed)
Patient presents with right knee pain for past 3-4 months. History of OA in both knees, seen on xray. He has been using voltaren gel with mild relief. He saw orthopedics last year about gel injections or knee surgery but did not continue following up. Patient has received steroid injections from our clinic before. He reports symptomatic relief after steroid injection for about 3 months. Discussed doing kenalog injections today for both knees since it helped previously. Patient agrees to the procedure.   Plan -received kenalog injection of both knees today -follow up in 3 months to reassess

## 2021-11-21 NOTE — Assessment & Plan Note (Signed)
Patient received flu vaccine today. 

## 2021-11-21 NOTE — Progress Notes (Signed)
Subjective:   Scott Burgess is a 71 y.o. male who presents for an Initial Medicare Annual Wellness Visit. I connected with  Scott Burgess on 11/21/21 by a  IN PERSON @ [facility]      Patient Location: Other:  IN PERSON [place]   Provider Location: Office/Clinic  I discussed the limitations of evaluation and management by telemedicine. The patient expressed understanding and agreed to proceed.   Review of Systems    DEFERRED  TO PCP  Cardiac Risk Factors include: advanced age (>30mn, >>27women);dyslipidemia;hypertension     Objective:    Today's Vitals   11/21/21 1429  PainSc: 6    There is no height or weight on file to calculate BMI.     11/21/2021    2:32 PM 11/21/2021    2:11 PM 08/30/2020    3:09 PM 03/03/2020    8:41 AM 11/30/2019    2:44 PM 09/03/2019    8:37 AM 06/23/2019   10:18 AM  Advanced Directives  Does Patient Have a Medical Advance Directive? Yes Yes No;Yes No No No No  Type of AIndustrial/product designerof AFreescale SemiconductorPower of ASamburgLiving will      Copy of HVaughnin Chart?   No - copy requested      Would patient like information on creating a medical advance directive?   No - Patient declined No - Patient declined No - Patient declined No - Patient declined No - Patient declined    Current Medications (verified) Outpatient Encounter Medications as of 11/21/2021  Medication Sig   meloxicam (MOBIC) 7.5 MG tablet Take 1 tablet (7.5 mg total) by mouth daily. (Patient not taking: Reported on 11/21/2021)   Olmesartan-amLODIPine-HCTZ 20-5-12.5 MG TABS TAKE 1 TABLET BY MOUTH DAILY.   rosuvastatin (CRESTOR) 20 MG tablet TAKE 1 TABLET (20 MG TOTAL) BY MOUTH DAILY.   zolpidem (AMBIEN) 5 MG tablet TAKE 1 TABLET BY MOUTH BEFORE BEDTIME AS NEEDED   No facility-administered encounter medications on file as of 11/21/2021.    Allergies (verified) Patient has no known allergies.   History: Past  Medical History:  Diagnosis Date   Hepatitis C, chronic (HCC)    genotype 2   Hernia    History of hepatitis C 01/22/2012   [place]/ elastography shows F2/F3 Completed 8 weeks Mayvert on 04/29/17. 12 weeks sustained viral response was undetectable.  Will need ongoing screening [place] U/[place] q 1 year, patient requests longer interval due to cost.     Hyperlipidemia    Hypertension    Osteoarthritis of right knee 02/11/2014   Personal history of colonic adenoma 03/17/2012   Pneumonia    STD (male)    Past Surgical History:  Procedure Laterality Date   [facility]  [facility]     TONSILLECTOMY     Family History  Problem Relation Age of Onset   Diabetes Mother    Diabetes Maternal Grandmother    Colon cancer Neg Hx    Colon polyps Neg Hx    Esophageal cancer Neg Hx    Rectal cancer Neg Hx    Stomach cancer Neg Hx    Social History   Socioeconomic History   Marital status: Single    Spouse name: Not on file   Number of children: 3   Years of education: Not on file   Highest education level: Not on file  Occupational History  Occupation: Airline pilot: [employer]  Tobacco Use   Smoking status: Former    Types: Cigarettes    Quit date: 01/15/2005    Years since quitting: 16.8   Smokeless tobacco: Never  Vaping Use   Vaping Use: Never used  Substance and Sexual Activity   Alcohol use: No    Alcohol/week: 0.0 standard drinks of alcohol   Drug use: No   Sexual activity: Not on file  Other Topics Concern   Not on file  Social History Narrative   Not on file   Social Determinants of Health   Financial Resource Strain: Low Risk  (11/21/2021)   Overall Financial Resource Strain (CARDIA)    Difficulty of Paying Living Expenses: Not hard at all  Food Insecurity: No Food Insecurity (11/21/2021)   Hunger Vital Sign    Worried About Running Out of Food in the Last Year: Never true    Ran Out of Food in the Last Year: Never true   Transportation Needs: No Transportation Needs (11/21/2021)   PRAPARE - Hydrologist (Medical): No    Lack of Transportation (Non-Medical): No  Physical Activity: Insufficiently Active (11/21/2021)   Exercise Vital Sign    Days of Exercise per Week: 2 days    Minutes of Exercise per Session: 20 min  Stress: No Stress Concern Present (11/21/2021)   [place]    Feeling of Stress : Not at all  Social Connections: Socially Isolated (11/21/2021)   Social Connection and Isolation Panel [NHANES]    Frequency of Communication with Friends and Family: More than three times a week    Frequency of Social Gatherings with Friends and Family: Twice a week    Attends Religious Services: Never    Marine scientist or Organizations: No    Attends Music therapist: Never    Marital Status: Never married    Tobacco Counseling Counseling given: Not Answered   Clinical Intake:  Pre-visit preparation completed: Yes  Pain : 0-10 Pain Score: 6  Pain Type: Chronic pain Pain Location: Knee Pain Orientation: Right Pain Descriptors / Indicators: Constant Pain Onset: Other (comment)     BMI - recorded: 32 Nutritional Status: BMI > 30  Obese Nutritional Risks: None Diabetes: No  How often do you need to have someone help you when you read instructions, pamphlets, or other written materials from your doctor or pharmacy?: 1 - Never What is the last grade level you completed in school?: 10 GRADE  Diabetic?NO      Information [facility] by [facility]   Activities of Daily Living    11/21/2021    2:33 PM 11/21/2021    2:07 PM  In your present state of health, do you have any difficulty performing the following activities:  Hearing? 0 0  Vision? 0 0  Difficulty concentrating or making decisions? 0 0  Walking or climbing stairs? 0 0  Dressing or bathing? 0 0  Doing errands,  shopping? 0 0  Preparing Food and eating ? N   Using the Toilet? N   In the past six months, have you accidently leaked urine? N   Do you have problems with loss of bowel control? N   Managing your Medications? N   Managing your Finances? N   Housekeeping or managing your Housekeeping? N     Patient Care Team: Lucious Groves, DO as PCP - General (  Internal Medicine)  Indicate any recent Medical Services you may have received from other than [facility] providers in the past year (date may be approximate).     Assessment:   This is a routine wellness examination for [place].  Hearing/Vision screen No results found.  Dietary issues and exercise activities discussed: Current Exercise Habits: Home exercise routine, Type of exercise: walking, Time (Minutes): 20, Frequency (Times/Week): 2, Weekly Exercise (Minutes/Week): 40, Intensity: Mild   Goals Addressed   None   Depression Screen    11/21/2021    2:32 PM 11/21/2021    2:14 PM 11/21/2021    2:07 PM 08/30/2020    4:25 PM 03/03/2020    8:40 AM 11/30/2019    2:47 PM 09/03/2019    8:37 AM  PHQ 2/9 Scores  PHQ - 2 Score 0 0 0 0 0 0 1  PHQ- 9 Score    0  0     Fall Risk    11/21/2021    2:33 PM 11/21/2021    2:05 PM 08/30/2020    3:08 PM 03/03/2020    8:40 AM 11/30/2019    2:44 PM  [place] in the past year? 0 0 0 0 1  Number falls in past yr: 0 0   0  Comment     PATIENT SLIPPED AT WORK ON FLOOR  Injury with Fall? 0 0   0  Risk for fall due to : No Fall Risks No Fall Risks No Fall Risks No Fall Risks   Follow up Falls evaluation completed;Falls prevention discussed Falls evaluation completed;Falls prevention discussed       FALL RISK PREVENTION PERTAINING TO THE HOME:  Any stairs in or around the home? No  If so, are there any without handrails? No  Home free of loose throw rugs in walkways, pet beds, electrical cords, etc? Yes  Adequate lighting in your home to reduce risk of falls? Yes   ASSISTIVE DEVICES UTILIZED  TO PREVENT FALLS:  Life alert? No  Use of a cane, walker or w/c? No  Grab bars in the bathroom? No  Shower chair or bench in shower? No  Elevated toilet seat or a handicapped toilet? No   TIMED UP AND GO:  Was the test performed? No .  Length of time to ambulate 10 feet: N/A sec.     Cognitive Function:        11/21/2021    2:33 PM  6CIT Screen  What Year? 0 points  What month? 0 points  What time? 0 points  Count back from 20 0 points  Months in reverse 0 points  Repeat phrase 0 points  Total Score 0 points    Immunizations Immunization History  Administered Date(s) Administered   Fluad Quad(high Dose 65+) 10/08/2019, 10/11/2020, 11/21/2021   Influenza, Seasonal, Injecte, Preservative Fre 12/21/2011   Influenza,inj,Quad PF,6+ Mos 03/19/2013, 12/16/2014, 11/28/2015, 11/06/2016, 10/03/2017   PFIZER(Purple Top)SARS-COV-2 Vaccination 02/06/2019, 10/20/2019   Pneumococcal Conjugate-13 01/31/2017   Pneumococcal Polysaccharide-23 08/25/2012, 08/14/2018   Tdap 12/21/2011    TDAP status: Up to date  Flu Vaccine status: Up to date  Pneumococcal vaccine status: Up to date  Covid-19 vaccine status: Completed vaccines  Qualifies for Shingles Vaccine? Yes   Zostavax completed No   Shingrix Completed?: No.    Education has been provided regarding the importance of this vaccine. Patient has been advised to call insurance company to determine out of pocket expense if they have not yet received this  vaccine. Advised may also receive vaccine at local pharmacy or Health Dept. Verbalized acceptance and understanding.  Screening Tests Health Maintenance  Topic Date Due   Zoster Vaccines- Shingrix (1 of 2) Never done   COVID-19 Vaccine (3 - Pfizer series) 12/15/2019   TETANUS/TDAP  12/20/2021   Medicare Annual Wellness (AWV)  11/22/2022   COLONOSCOPY (Pts 45-78yr Insurance coverage will need to be confirmed)  05/25/2025   Pneumonia Vaccine 70 Years old  Completed    INFLUENZA VACCINE  Completed   Hepatitis C Screening  Completed   HPV VACCINES  Aged Out    Health Maintenance  Health Maintenance Due  Topic Date Due   Zoster Vaccines- Shingrix (1 of 2) Never done   COVID-19 Vaccine (3 - Pfizer series) 12/15/2019    Colorectal cancer screening: Type of screening: Colonoscopy. Completed 05/25/2020. Repeat every 5 years  Lung Cancer Screening: (Low Dose CT Chest recommended if Age 70-80years, 30 pack-year currently smoking OR have quit w/in 15years.) does not qualify.   Lung Cancer Screening Referral: DEFERRED TO PCP   Additional Screening:  Hepatitis C Screening: does qualify; Completed 08/14/2018  Vision Screening: Recommended annual ophthalmology exams for early detection of glaucoma and other disorders of the eye. Is the patient up to date with their annual eye exam?  NO  Who is the provider or what is the name of the office in which the patient attends annual eye exams?  [facility] If pt is not established with a provider, would they like to be referred to a provider to establish care? No .   Dental Screening: Recommended annual dental exams for proper oral hygiene  Community Resource Referral / Chronic Care Management: CRR required this visit?  No   CCM required this visit?  No      Plan:     I have personally reviewed and noted the following in the patient's chart:   Medical and social history Use of alcohol, tobacco or illicit drugs  Current medications and supplements including opioid prescriptions. Patient is not currently taking opioid prescriptions. Functional ability and status Nutritional status Physical activity Advanced directives List of other physicians Hospitalizations, surgeries, and ER visits in previous 12 months Vitals Screenings to include cognitive, depression, and falls Referrals and appointments  In addition, I have reviewed and discussed with patient certain preventive protocols, quality  metrics, and best practice recommendations. A written personalized care plan for preventive services as well as general preventive health recommendations were provided to patient.     aJudyann Munson CMA   11/21/2021   [facility]    Mr. W[facility]son, Thank you for taking time to come for your Medicare Wellness Visit. I appreciate your ongoing commitment to your health goals. Please review the following plan we discussed and let me know if I can assist you in the future.   These are the goals we discussed:  Goals      Blood Pressure < 140/90        This is a list of the screening recommended for you and due dates:  Health Maintenance  Topic Date Due   Zoster (Shingles) Vaccine (1 of 2) Never done   COVID-19 Vaccine (3 - Pfizer series) 12/15/2019   Tetanus Vaccine  12/20/2021   Medicare Annual Wellness Visit  11/22/2022   Colon Cancer Screening  05/25/2025   Pneumonia Vaccine  Completed   Flu Shot  Completed   Hepatitis C Screening:  USPSTF Recommendation to screen - Ages 47-79 yo.  Completed   HPV Vaccine  Aged Out

## 2021-11-21 NOTE — Progress Notes (Signed)
CC: Routine follow-up  HPI:  Scott Burgess is a 71 y.o. male living with a history stated below and presents today for routine follow-up. Please see problem based assessment and plan for additional details.  Past Medical History:  Diagnosis Date   Hepatitis C, chronic (HCC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 12 weeks sustained viral response was undetectable.  Will need ongoing screening for [facility] recommended by [place] 1 year, patient requests longer interval due to cost.     Hyperlipidemia    Hypertension    Osteoarthritis of right knee 02/11/2014   Personal history of colonic adenoma 03/17/2012   Pneumonia    STD (male)     Current Outpatient Medications on File Prior to Visit  Medication Sig Dispense Refill   meloxicam (MOBIC) 7.5 MG tablet Take 1 tablet (7.5 mg total) by mouth daily. (Patient not taking: Reported on 11/21/2021) 30 tablet 0   No current facility-administered medications on file prior to visit.   Review of Systems: ROS negative except for what is noted on the assessment and plan.  Vitals:   11/21/21 1358 11/21/21 1414  BP: (!) 166/91 (!) 171/82  Pulse: 85 84  Temp: 97.7 F (36.5 C)   TempSrc: Oral   SpO2: 98%   Weight: 223 lb 14.4 oz (101.6 kg)   Height: '5\' 10"'$  (1.778 m)     Physical Exam: Constitutional: well-appearing male, sitting in chair, in no acute distress HENT: normocephalic atraumatic Neck: supple Cardiovascular: regular rate and rhythm, no m/r/g Pulmonary/Chest: normal work of breathing on room air MSK:   Right Knee: no swelling or erythema, normal ROM, crepitus present  Left Knee: no swelling or erythema, normal ROM, no crepitus Neurological: alert & oriented x 3 Skin: warm and dry Psych: normal mood and behavior  Assessment & Plan:   Essential hypertension BP: (!) 171/82   Repeat BP remains elevated today. Denies any symptoms of  headache, vision changes, dyspnea, chest pain or n/v. He has not been taking his antihypertensives for over a year. Not because of side effects but wanted to see if he could tolerate being off of them.  Discussed importance of blood pressure management on overall health. He is agreeable to restarting his medications. Will refill medications today.   Plan -Restart olmesartan-amlodipine 20-5 mg daily and HCTZ 12.5 mg daily (note: triple combination pill not on his formulary) -BMP today, will need repeat BMP at next visit since restarting medications  Osteoarthritis of knees, bilateral Patient presents with right knee pain for past 3-4 months. History of OA in both knees, seen on xray. He has been using voltaren gel with mild relief. He saw orthopedics last year about gel injections or knee surgery but did not continue following up. Patient has received steroid injections from our clinic before. He reports symptomatic relief after steroid injection for about 3 months. Discussed doing kenalog injections today for both knees since it helped previously. Patient agrees to the procedure.   Plan -received kenalog injection of both knees today -follow up in 3 months to reassess  Routine health maintenance Patient received flu vaccine today.   Insomnia Patient with history of insomnia that is well controlled with Ambien 5 mg as needed. He tried melatonin before and it sometimes helps. He does watch the nightly news in bed. Difficulty falling asleep but able to sleep about 6-7  hours most nights.   Plan -refilled Ambien 5 mg nightly  PAD (peripheral artery disease) ([place]) Patient on aspirin 81 mg daily but has not been taking Crestor 20 mg. Currently asymptomatic. Discussed restarting Crestor and repeating lipid panel which the patient is agreeable to.   Plan -lipid panel today -refilled Crestor 20 mg daily    Patient seen with Dr. Alden Hipp, D.O. [facility],  PGY-1 Phone: [phone] Date 11/21/2021 Time 9:01 PM   PROCEDURE NOTE  PROCEDURE: right knee joint steroid injection.  PREOPERATIVE DIAGNOSIS: Osteoarthritis of the bilateral knee.  POSTOPERATIVE DIAGNOSIS: Osteoarthritis of the bilateral knee.  PROCEDURE: The patient was apprised of the risks and the benefits of the procedure and informed consent was obtained. Time-out procedure was performed, with confirmation of the patient's name, date of birth, and correct identification of the right knee to be injected. The patient's knee was then marked at the appropriate site for injection placement. The knee was sterilely prepped with Betadine. A 40 mg (1 milliliter) solution of Kenalog was drawn up into a 5 mL syringe with a 2 mL of 1% lidocaine. The patient was injected with a 25-gauge needle at the anterior-lateral aspect of his right flexed knee. There were no complications. The patient tolerated the procedure well. There was minimal bleeding. The patient was instructed to ice his knee upon leaving clinic and refrain from overuse over the next 3 days. The patient was instructed to go to the emergency room with any usual pain, swelling, or redness occurred in the injected area. The patient was given a followup appointment to evaluate response to the injection to his increased range of motion and reduction of pain.  The procedure was supervised by attending physician, Dr. Heber [place].  PROCEDURE NOTE  PROCEDURE: left knee joint steroid injection.  PREOPERATIVE DIAGNOSIS: Osteoarthritis of the bilateral knee.  POSTOPERATIVE DIAGNOSIS: Osteoarthritis of the bilateral knee.  PROCEDURE: The patient was apprised of the risks and the benefits of the procedure and informed consent was obtained. Time-out procedure was performed, with confirmation of the patient's name, date of birth, and correct identification of the left knee to be injected. The patient's knee was then marked at the appropriate site for  injection placement. The knee was sterilely prepped with Betadine. A 40 mg (1 milliliter) solution of Kenalog was drawn up into a 5 mL syringe with a 2 mL of 1% lidocaine. The patient was injected with a 25-gauge needle at the anterior-lateral aspect of his left flexed knee. There were no complications. The patient tolerated the procedure well. There was minimal bleeding. The patient was instructed to ice his knee upon leaving clinic and refrain from overuse over the next 3 days. The patient was instructed to go to the emergency room with any usual pain, swelling, or redness occurred in the injected area. The patient was given a followup appointment to evaluate response to the injection to his increased range of motion and reduction of pain.  The procedure was supervised by attending physician, Dr. Heber Justice.

## 2021-11-21 NOTE — Patient Instructions (Addendum)
Thank you, ScottJonan Burgess for allowing [place] to provide your care today.   Blood pressure -Your blood pressure was elevated today. Refill sent today for the olmesartan-amlodipine-HCTZ to your pharmacy. -Continue measuring your blood pressure at home.  If you have any concerns, please contact the clinic.  Cholesterol -We will repeat cholesterol panel today. -Refill sent for Crestor 20 mg daily to your pharmacy.  Osteoarthritis of both knees -You received steroid injections for both knees today.  You may continue using Voltaren gel to help with the pain.  -Refill sent for Ambien 5 mg at night for insomnia. -Blood work today to check electrolytes and kidney function. -Received flu shot today.   I have ordered the following labs for you:  Lab Orders         BMP8+Anion Gap         Lipid Profile      I have ordered the following medication/changed the following medications:   Stop the following medications: Medications Discontinued During This Encounter  Medication Reason   zolpidem (AMBIEN) 5 MG tablet Reorder   rosuvastatin (CRESTOR) 20 MG tablet Reorder   Olmesartan-amLODIPine-HCTZ 20-5-12.5 MG TABS Reorder     Start the following medications: Meds ordered this encounter  Medications   Olmesartan-amLODIPine-HCTZ 20-5-12.5 MG TABS    Sig: TAKE 1 TABLET BY MOUTH DAILY.    Dispense:  30 tablet    Refill:  11   rosuvastatin (CRESTOR) 20 MG tablet    Sig: TAKE 1 TABLET (20 MG TOTAL) BY MOUTH DAILY.    Dispense:  30 tablet    Refill:  11   zolpidem (AMBIEN) 5 MG tablet    Sig: TAKE 1 TABLET BY MOUTH BEFORE BEDTIME AS NEEDED    Dispense:  30 tablet    Refill:  5     Follow up: 3 months    Should you have any questions or concerns please call the internal medicine clinic at [phone].    Angelique Blonder, D.O. [place]

## 2021-11-21 NOTE — Patient Instructions (Signed)
Health Maintenance, Male Adopting a healthy lifestyle and getting preventive care are important in promoting health and wellness. Ask your health care provider about: The right schedule for you to have regular tests and exams. Things you can do on your own to prevent diseases and keep yourself healthy. What should I know about diet, weight, and exercise? Eat a healthy diet  Eat a diet that includes plenty of vegetables, fruits, low-fat dairy products, and lean protein. Do not eat a lot of foods that are high in solid fats, added sugars, or sodium. Maintain a healthy weight Body mass index (BMI) is a measurement that can be used to identify possible weight problems. It estimates body fat based on height and weight. Your health care provider can help determine your BMI and help you achieve or maintain a healthy weight. Get regular exercise Get regular exercise. This is one of the most important things you can do for your health. Most adults should: Exercise for at least 150 minutes each week. The exercise should increase your heart rate and make you sweat (moderate-intensity exercise). Do strengthening exercises at least twice a week. This is in addition to the moderate-intensity exercise. Spend less time sitting. Even light physical activity can be beneficial. Watch cholesterol and blood lipids Have your blood tested for lipids and cholesterol at 70 years of age, then have this test every 5 years. You may need to have your cholesterol levels checked more often if: Your lipid or cholesterol levels are high. You are older than 70 years of age. You are at high risk for heart disease. What should I know about cancer screening? Many types of cancers can be detected early and may often be prevented. Depending on your health history and family history, you may need to have cancer screening at various ages. This may include screening for: Colorectal cancer. Prostate cancer. Skin cancer. Lung  cancer. What should I know about heart disease, diabetes, and high blood pressure? Blood pressure and heart disease High blood pressure causes heart disease and increases the risk of stroke. This is more likely to develop in people who have high blood pressure readings or are overweight. Talk with your health care provider about your target blood pressure readings. Have your blood pressure checked: Every 3-5 years if you are 18-39 years of age. Every year if you are 40 years old or older. If you are between the ages of 65 and 75 and are a current or former smoker, ask your health care provider if you should have a one-time screening for abdominal aortic aneurysm (AAA). Diabetes Have regular diabetes screenings. This checks your fasting blood sugar level. Have the screening done: Once every three years after age 45 if you are at a normal weight and have a low risk for diabetes. More often and at a younger age if you are overweight or have a high risk for diabetes. What should I know about preventing infection? Hepatitis B If you have a higher risk for hepatitis B, you should be screened for this virus. Talk with your health care provider to find out if you are at risk for hepatitis B infection. Hepatitis C Blood testing is recommended for: Everyone born from 1945 through 1965. Anyone with known risk factors for hepatitis C. Sexually transmitted infections (STIs) You should be screened each year for STIs, including gonorrhea and chlamydia, if: You are sexually active and are younger than 70 years of age. You are older than 70 years of age and your   health care provider tells you that you are at risk for this type of infection. Your sexual activity has changed since you were last screened, and you are at increased risk for chlamydia or gonorrhea. Ask your health care provider if you are at risk. Ask your health care provider about whether you are at high risk for HIV. Your health care provider  may recommend a prescription medicine to help prevent HIV infection. If you choose to take medicine to prevent HIV, you should first get tested for HIV. You should then be tested every 3 months for as long as you are taking the medicine. Follow these instructions at home: Alcohol use Do not drink alcohol if your health care provider tells you not to drink. If you drink alcohol: Limit how much you have to 0-2 drinks a day. Know how much alcohol is in your drink. In the [place] one drink equals one 12 oz bottle of beer (355 mL), one 5 oz glass of wine (148 mL), or one 1 oz glass of hard liquor (44 mL). Lifestyle Do not use any products that contain nicotine or tobacco. These products include cigarettes, chewing tobacco, and vaping devices, such as e-cigarettes. If you need help quitting, ask your health care provider. Do not use street drugs. Do not share needles. Ask your health care provider for help if you need support or information about quitting drugs. General instructions Schedule regular health, dental, and eye exams. Stay current with your vaccines. Tell your health care provider if: You often feel depressed. You have ever been abused or do not feel safe at home. Summary Adopting a healthy lifestyle and getting preventive care are important in promoting health and wellness. Follow your health care provider's instructions about healthy diet, exercising, and getting tested or screened for diseases. Follow your health care provider's instructions on monitoring your cholesterol and blood pressure. This information is not intended to replace advice given to you by your health care provider. Make sure you discuss any questions you have with your health care provider. Document Revised: 05/23/2020 Document Reviewed: 05/23/2020 Elsevier Patient Education  2023 Elsevier Inc.  

## 2021-11-22 LAB — BMP8+ANION GAP
Anion Gap: 17 mmol/L (ref 10.0–18.0)
BUN/Creatinine Ratio: 15 (ref 10–24)
BUN: 15 mg/dL (ref 8–27)
CO2: 21 mmol/L (ref 20–29)
Calcium: 9.5 mg/dL (ref 8.6–10.2)
Chloride: 105 mmol/L (ref 96–106)
Creatinine, Ser: 1.01 mg/dL (ref 0.76–1.27)
Glucose: 84 mg/dL (ref 70–99)
Potassium: 3.8 mmol/L (ref 3.5–5.2)
Sodium: 143 mmol/L (ref 134–144)
eGFR: 80 mL/min/{1.73_m2} (ref >59)

## 2021-11-22 LAB — LIPID PANEL
Chol/HDL Ratio: 5.6 ratio — ABNORMAL HIGH (ref 0.0–5.0)
Cholesterol, Total: 185 mg/dL (ref 100–199)
HDL: 33 mg/dL — ABNORMAL LOW (ref >39)
LDL Chol Calc (NIH): 126 mg/dL — ABNORMAL HIGH (ref 0–99)
Triglycerides: 145 mg/dL (ref 0–149)
VLDL Cholesterol Cal: 26 mg/dL (ref 5–40)

## 2021-11-24 NOTE — Progress Notes (Signed)
Internal Medicine Clinic Attending  I saw and evaluated the patient.  I personally confirmed the key portions of the history and exam documented by the resident  and I reviewed pertinent patient test results.  The assessment, diagnosis, and plan were formulated together and I agree with the documentation in the resident's note. I was present for both knee injections, tolerated well without complication

## 2021-11-29 NOTE — Progress Notes (Signed)
Internal Medicine Clinic Attending  Case and documentation of Dr. Zheng reviewed.  I reviewed the AWV findings.  I agree with the assessment, diagnosis, and plan of care documented in the AWV note.      

## 2021-12-20 ENCOUNTER — Other Ambulatory Visit (HOSPITAL_COMMUNITY): Payer: Self-pay

## 2022-01-01 ENCOUNTER — Other Ambulatory Visit (HOSPITAL_COMMUNITY): Payer: Self-pay

## 2022-01-01 MED ORDER — IBUPROFEN 800 MG PO TABS
800.0000 mg | ORAL_TABLET | Freq: Three times a day (TID) | ORAL | 1 refills | Status: DC | PRN
  Filled 2022-01-01: qty 30, 10d supply, fill #0

## 2022-01-01 MED ORDER — OXYCODONE-ACETAMINOPHEN 5-325 MG PO TABS
1.0000 | ORAL_TABLET | Freq: Four times a day (QID) | ORAL | 0 refills | Status: DC | PRN
  Filled 2022-01-01: qty 24, 3d supply, fill #0

## 2022-01-12 ENCOUNTER — Other Ambulatory Visit (HOSPITAL_COMMUNITY): Payer: Self-pay

## 2022-01-18 ENCOUNTER — Other Ambulatory Visit (HOSPITAL_COMMUNITY): Payer: Self-pay

## 2022-01-19 ENCOUNTER — Other Ambulatory Visit (HOSPITAL_COMMUNITY): Payer: Self-pay

## 2022-01-26 ENCOUNTER — Other Ambulatory Visit (HOSPITAL_COMMUNITY): Payer: Self-pay

## 2022-01-26 ENCOUNTER — Telehealth: Payer: Self-pay

## 2022-01-26 MED ORDER — NAPROXEN 500 MG PO TABS
500.0000 mg | ORAL_TABLET | Freq: Two times a day (BID) | ORAL | 2 refills | Status: DC | PRN
  Filled 2022-01-26: qty 30, 15d supply, fill #0

## 2022-01-26 NOTE — Telephone Encounter (Signed)
RTC to patient has a lot of pain I his knee that goes to his back.  States the Steroid Shot he received has worn off.  Was given Ibuprofen 800 mg that worked for his teeth.  Has taken Tylenol 500 mg as well.  Would like to get something for the hip pain.  Does not want to take Percocet.. Also stated needs something like a can to lean on to help his walking.  Can send prescription to the [facility].

## 2022-01-26 NOTE — Telephone Encounter (Signed)
Sent in naproxen '500mg'$  bid prn for hip or knee pain.

## 2022-01-26 NOTE — Telephone Encounter (Signed)
Requesting to speak with a nurse about having back and knee pain. Want to know what should he do. Please call pt back.

## 2022-01-29 ENCOUNTER — Other Ambulatory Visit (HOSPITAL_COMMUNITY): Payer: Self-pay

## 2022-02-19 ENCOUNTER — Other Ambulatory Visit (HOSPITAL_COMMUNITY): Payer: Self-pay

## 2022-02-22 ENCOUNTER — Other Ambulatory Visit: Payer: Self-pay

## 2022-02-22 ENCOUNTER — Ambulatory Visit (INDEPENDENT_AMBULATORY_CARE_PROVIDER_SITE_OTHER): Payer: PPO | Admitting: Internal Medicine

## 2022-02-22 ENCOUNTER — Encounter: Payer: Self-pay | Admitting: Internal Medicine

## 2022-02-22 ENCOUNTER — Other Ambulatory Visit (HOSPITAL_COMMUNITY): Payer: Self-pay

## 2022-02-22 VITALS — BP 127/64 | HR 88 | Temp 98.0°F | Ht 70.0 in | Wt 220.8 lb

## 2022-02-22 DIAGNOSIS — F5101 Primary insomnia: Secondary | ICD-10-CM

## 2022-02-22 DIAGNOSIS — I10 Essential (primary) hypertension: Secondary | ICD-10-CM

## 2022-02-22 DIAGNOSIS — Z9189 Other specified personal risk factors, not elsewhere classified: Secondary | ICD-10-CM

## 2022-02-22 DIAGNOSIS — Z87891 Personal history of nicotine dependence: Secondary | ICD-10-CM | POA: Diagnosis not present

## 2022-02-22 DIAGNOSIS — I739 Peripheral vascular disease, unspecified: Secondary | ICD-10-CM

## 2022-02-22 DIAGNOSIS — M545 Low back pain, unspecified: Secondary | ICD-10-CM

## 2022-02-22 DIAGNOSIS — G8929 Other chronic pain: Secondary | ICD-10-CM

## 2022-02-22 DIAGNOSIS — M170 Bilateral primary osteoarthritis of knee: Secondary | ICD-10-CM

## 2022-02-22 MED ORDER — AMLODIPINE-OLMESARTAN 5-20 MG PO TABS
1.0000 | ORAL_TABLET | Freq: Every day | ORAL | 3 refills | Status: DC
  Filled 2022-02-22 – 2022-03-20 (×2): qty 90, 90d supply, fill #0
  Filled 2022-06-19: qty 90, 90d supply, fill #1
  Filled 2022-09-12: qty 90, 90d supply, fill #2

## 2022-02-22 MED ORDER — OMEPRAZOLE 20 MG PO CPDR
20.0000 mg | DELAYED_RELEASE_CAPSULE | Freq: Every day | ORAL | 1 refills | Status: DC
  Filled 2022-02-22 – 2022-03-20 (×2): qty 90, 90d supply, fill #0
  Filled 2022-06-19: qty 90, 90d supply, fill #1

## 2022-02-22 MED ORDER — ZOLPIDEM TARTRATE 5 MG PO TABS
5.0000 mg | ORAL_TABLET | Freq: Every evening | ORAL | 1 refills | Status: DC | PRN
  Filled 2022-02-22 – 2022-03-20 (×2): qty 90, 90d supply, fill #0
  Filled 2022-06-19: qty 90, 90d supply, fill #1

## 2022-02-22 MED ORDER — SHINGRIX 50 MCG/0.5ML IM SUSR
0.5000 mL | Freq: Once | INTRAMUSCULAR | 0 refills | Status: AC
  Filled 2022-02-22: qty 0.5, 1d supply, fill #0

## 2022-02-22 MED ORDER — HYDROCHLOROTHIAZIDE 12.5 MG PO CAPS
12.5000 mg | ORAL_CAPSULE | Freq: Every day | ORAL | 3 refills | Status: DC
  Filled 2022-02-22 – 2022-03-20 (×2): qty 90, 90d supply, fill #0
  Filled 2022-06-19: qty 90, 90d supply, fill #1
  Filled 2022-09-12: qty 90, 90d supply, fill #2

## 2022-02-22 MED ORDER — ROSUVASTATIN CALCIUM 10 MG PO TABS
10.0000 mg | ORAL_TABLET | Freq: Every day | ORAL | 1 refills | Status: DC
  Filled 2022-02-22 – 2022-03-20 (×2): qty 90, 90d supply, fill #0
  Filled 2022-06-19: qty 90, 90d supply, fill #1

## 2022-02-22 MED ORDER — IBUPROFEN 800 MG PO TABS
800.0000 mg | ORAL_TABLET | Freq: Two times a day (BID) | ORAL | 4 refills | Status: DC | PRN
  Filled 2022-02-22 – 2022-03-20 (×2): qty 60, 30d supply, fill #0
  Filled 2022-06-19: qty 60, 30d supply, fill #1

## 2022-02-22 NOTE — Assessment & Plan Note (Signed)
Blood pressure is well-controlled on our triple therapy olmesartan-amlodipine 20-5 and HCTZ 12.5 daily

## 2022-02-22 NOTE — Assessment & Plan Note (Signed)
Doing well with Ambien we will refill

## 2022-02-22 NOTE — Assessment & Plan Note (Signed)
I suspect his low back pain may partially be due to the altered walking mechanics from his knee pain and I do not think this needs any aggressive evaluation at this time as it is fairly mild.  I do suspect the ibuprofen will help him and he will let me know if the his pain significantly worsens.

## 2022-02-22 NOTE — Progress Notes (Signed)
Established Patient Office Visit  Subjective   Patient ID: Scott Burgess, male    DOB: [birth year]  Age: 72 y.o. MRN: [id number]  Chief Complaint  Patient presents with   Check-up Visit    F/U BP. Pain lower back and right knee.   Scott Burgess follows up today for blood pressure and osteoarthritis.  His blood pressure logs he has been checking 1-2 times a day and has excellent blood pressure control.  He has been tolerating his 3 drug therapy well without adverse side effects. He has been getting some dental work completed with multiple upper teeth pulled and plans for partial dentures.  Dentist prescribed 800 mg of ibuprofen which she notes is more effective for chronic knee pain due to OA as well as some increased back and thigh pain he has had lately.  He has been wearing a knee sleeve but not a knee brace on his right knee.  He did see orthopedics in 2022 and was briefly following with them he held off getting recommended Synvisc injections but now thinks he is ready for them.  He thinks his pain is worse in his right knee but is present bilaterally more recently he has been having more pain in his anterior thighs and right low back.  His walking mechanics I note a limp.  He continues to take a 5 mg Ambien most nights this is doing well for his insomnia and feels well rested.  He says he has been breaking his 20 mg Crestor to 10 mg and tolerating it well there he thinks it makes him feel achy to take a full tablet    Objective:     BP 127/64 (BP Location: Right Arm, Patient Position: Sitting, Cuff Size: Large)   Pulse 88   Temp 98 F (36.7 C) (Oral)   Ht '5\' 10"'$  (1.778 m)   Wt 220 lb 12.8 oz (100.2 kg)   SpO2 99% Comment: RA  BMI 31.68 kg/m  BP Readings from Last 3 Encounters:  02/22/22 127/64  11/21/21 (!) 171/82  08/30/20 (!) 150/81   Wt Readings from Last 3 Encounters:  02/22/22 220 lb 12.8 oz (100.2 kg)  11/21/21 223 lb 14.4 oz (101.6 kg)  09/05/20 220 lb (99.8 kg)       Physical Exam Constitutional:      Appearance: Normal appearance.  Eyes:     Conjunctiva/sclera: Conjunctivae normal.  Pulmonary:     Effort: Pulmonary effort is normal.  Musculoskeletal:     Right lower leg: No edema.     Left lower leg: No edema.  Neurological:     Mental Status: He is alert.     Gait: Gait abnormal.  Psychiatric:        Mood and Affect: Mood normal.        Behavior: Behavior normal.      No results found for any visits on 02/22/22.  Last CBC Lab Results  Component Value Date   WBC 5.6 04/16/2019   HGB 14.9 04/16/2019   HCT 44.6 04/16/2019   MCV 92 04/16/2019   MCH 30.6 04/16/2019   RDW 12.8 04/16/2019   PLT 299 93/57/0177   Last metabolic panel Lab Results  Component Value Date   GLUCOSE 84 11/21/2021   NA 143 11/21/2021   K 3.8 11/21/2021   CL 105 11/21/2021   CO2 21 11/21/2021   BUN 15 11/21/2021   CREATININE 1.01 11/21/2021   EGFR 80 11/21/2021   CALCIUM 9.5 11/21/2021   PROT  7.7 04/16/2019   ALBUMIN 4.9 (H) 04/16/2019   LABGLOB 2.8 04/16/2019   AGRATIO 1.8 04/16/2019   BILITOT 0.6 04/16/2019   ALKPHOS 63 04/16/2019   AST 34 04/16/2019   ALT 31 04/16/2019   Last lipids Lab Results  Component Value Date   CHOL 185 11/21/2021   HDL 33 (L) 11/21/2021   LDLCALC 126 (H) 11/21/2021   TRIG 145 11/21/2021   CHOLHDL 5.6 (H) 11/21/2021      The 10-year ASCVD risk score (Arnett DK, et al., 2019) is: 12.8%    Assessment & Plan:   Problem List Items Addressed This Visit       Cardiovascular and Mediastinum   Essential hypertension (Chronic)    Blood pressure is well-controlled on our triple therapy olmesartan-amlodipine 20-5 and HCTZ 12.5 daily      Relevant Medications   amLODipine-olmesartan (AZOR) 5-20 MG tablet (Start on 03/21/2022)   hydrochlorothiazide (MICROZIDE) 12.5 MG capsule (Start on 03/21/2022)   rosuvastatin (CRESTOR) 10 MG tablet (Start on 03/21/2022)   aspirin EC 81 MG tablet   PAD (peripheral artery disease)  ([place]) - Primary (Chronic)    He continues to take 81 mg aspirin as well as Crestor he is now taking 10 mg as this was max tolerated.  Will plan to check lipid panel at next visit.      Relevant Medications   amLODipine-olmesartan (AZOR) 5-20 MG tablet (Start on 03/21/2022)   hydrochlorothiazide (MICROZIDE) 12.5 MG capsule (Start on 03/21/2022)   rosuvastatin (CRESTOR) 10 MG tablet (Start on 03/21/2022)   aspirin EC 81 MG tablet     Musculoskeletal and Integument   Osteoarthritis of knees, bilateral (Chronic)    I advised that he may continue 800 mg of ibuprofen I do not want him to take more than 2 a day.  His blood pressure is well-controlled.  I will go ahead and prescribe him 20 mg of omeprazole instructed him to take this daily when he is taking ibuprofen to help with GI protection.  I am also can refer him back to Ortho for reconsideration of Synvisc injections.      Relevant Medications   ibuprofen (ADVIL) 800 MG tablet (Start on 03/21/2022)   aspirin EC 81 MG tablet   Other Relevant Orders   Ambulatory referral to Orthopedic Surgery     Other   Insomnia (Chronic)    Doing well with Ambien we will refill      Relevant Medications   zolpidem (AMBIEN) 5 MG tablet (Start on 03/21/2022)   Candidate for statin therapy due to risk of future cardiovascular event (Chronic)   Relevant Medications   rosuvastatin (CRESTOR) 10 MG tablet (Start on 03/21/2022)   Low back pain    I suspect his low back pain may partially be due to the altered walking mechanics from his knee pain and I do not think this needs any aggressive evaluation at this time as it is fairly mild.  I do suspect the ibuprofen will help him and he will let me know if the his pain significantly worsens.      Relevant Medications   ibuprofen (ADVIL) 800 MG tablet (Start on 03/21/2022)   aspirin EC 81 MG tablet    Return in about 6 months (around 08/23/2022).    Scott Groves, DO

## 2022-02-22 NOTE — Assessment & Plan Note (Signed)
I advised that he may continue 800 mg of ibuprofen I do not want him to take more than 2 a day.  His blood pressure is well-controlled.  I will go ahead and prescribe him 20 mg of omeprazole instructed him to take this daily when he is taking ibuprofen to help with GI protection.  I am also can refer him back to Ortho for reconsideration of Synvisc injections.

## 2022-02-22 NOTE — Assessment & Plan Note (Signed)
He continues to take 81 mg aspirin as well as Crestor he is now taking 10 mg as this was max tolerated.  Will plan to check lipid panel at next visit.

## 2022-03-05 ENCOUNTER — Encounter: Payer: Self-pay | Admitting: Physician Assistant

## 2022-03-05 ENCOUNTER — Ambulatory Visit (INDEPENDENT_AMBULATORY_CARE_PROVIDER_SITE_OTHER): Payer: PPO

## 2022-03-05 ENCOUNTER — Other Ambulatory Visit (HOSPITAL_COMMUNITY): Payer: Self-pay

## 2022-03-05 ENCOUNTER — Ambulatory Visit (INDEPENDENT_AMBULATORY_CARE_PROVIDER_SITE_OTHER): Payer: PPO | Admitting: Physician Assistant

## 2022-03-05 ENCOUNTER — Telehealth: Payer: Self-pay

## 2022-03-05 VITALS — Ht 70.0 in | Wt 220.0 lb

## 2022-03-05 DIAGNOSIS — M25561 Pain in right knee: Secondary | ICD-10-CM

## 2022-03-05 DIAGNOSIS — M1711 Unilateral primary osteoarthritis, right knee: Secondary | ICD-10-CM | POA: Diagnosis not present

## 2022-03-05 DIAGNOSIS — M1712 Unilateral primary osteoarthritis, left knee: Secondary | ICD-10-CM

## 2022-03-05 DIAGNOSIS — M545 Low back pain, unspecified: Secondary | ICD-10-CM | POA: Diagnosis not present

## 2022-03-05 DIAGNOSIS — M25562 Pain in left knee: Secondary | ICD-10-CM
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
## 2022-03-05 NOTE — Telephone Encounter (Signed)
Auth needed for bilat knee gel

## 2022-03-05 NOTE — Telephone Encounter (Signed)
VOB submitted for Monovisc, bilateral knee  

## 2022-03-13 ENCOUNTER — Encounter: Payer: Self-pay | Admitting: Physical Therapy

## 2022-03-13 ENCOUNTER — Ambulatory Visit: Payer: PPO | Admitting: Physical Therapy

## 2022-03-13 ENCOUNTER — Other Ambulatory Visit: Payer: Self-pay

## 2022-03-13 DIAGNOSIS — M6281 Muscle weakness (generalized): Secondary | ICD-10-CM

## 2022-03-13 DIAGNOSIS — M5459 Other low back pain: Secondary | ICD-10-CM | POA: Diagnosis not present

## 2022-03-13 DIAGNOSIS — M25561 Pain in right knee: Secondary | ICD-10-CM | POA: Diagnosis not present

## 2022-03-13 DIAGNOSIS — G8929 Other chronic pain: Secondary | ICD-10-CM | POA: Diagnosis not present

## 2022-03-13 DIAGNOSIS — M25562 Pain in left knee: Secondary | ICD-10-CM | POA: Diagnosis not present
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
## 2022-03-20 ENCOUNTER — Other Ambulatory Visit (HOSPITAL_COMMUNITY): Payer: Self-pay

## 2022-03-20 MED ORDER — TETANUS-DIPHTH-ACELL PERTUSSIS 5-2.5-18.5 LF-MCG/0.5 IM SUSY
0.5000 mL | PREFILLED_SYRINGE | Freq: Once | INTRAMUSCULAR | 0 refills | Status: AC
  Filled 2022-03-20: qty 0.5, 1d supply, fill #0

## 2022-03-21 ENCOUNTER — Other Ambulatory Visit (HOSPITAL_COMMUNITY): Payer: Self-pay

## 2022-03-27 ENCOUNTER — Ambulatory Visit: Payer: PPO | Admitting: Physical Therapy

## 2022-03-27 ENCOUNTER — Encounter: Payer: Self-pay | Admitting: Physical Therapy

## 2022-03-27 DIAGNOSIS — M6281 Muscle weakness (generalized): Secondary | ICD-10-CM

## 2022-03-27 DIAGNOSIS — M25561 Pain in right knee: Secondary | ICD-10-CM

## 2022-03-27 DIAGNOSIS — M5459 Other low back pain: Secondary | ICD-10-CM

## 2022-03-27 DIAGNOSIS — M25562 Pain in left knee: Secondary | ICD-10-CM | POA: Diagnosis not present

## 2022-03-27 DIAGNOSIS — G8929 Other chronic pain: Secondary | ICD-10-CM | POA: Diagnosis not present
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
## 2022-04-03 ENCOUNTER — Ambulatory Visit: Payer: PPO | Admitting: Physical Therapy

## 2022-04-03 ENCOUNTER — Encounter: Payer: Self-pay | Admitting: Physical Therapy

## 2022-04-03 DIAGNOSIS — M5459 Other low back pain: Secondary | ICD-10-CM | POA: Diagnosis not present

## 2022-04-03 DIAGNOSIS — G8929 Other chronic pain: Secondary | ICD-10-CM | POA: Diagnosis not present

## 2022-04-03 DIAGNOSIS — M25561 Pain in right knee: Secondary | ICD-10-CM | POA: Diagnosis not present

## 2022-04-03 DIAGNOSIS — M6281 Muscle weakness (generalized): Secondary | ICD-10-CM

## 2022-04-03 DIAGNOSIS — M25562 Pain in left knee: Secondary | ICD-10-CM
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
## 2022-04-10 ENCOUNTER — Encounter: Payer: Self-pay | Admitting: Physical Therapy

## 2022-04-10 ENCOUNTER — Ambulatory Visit: Payer: PPO | Admitting: Physical Therapy

## 2022-04-10 DIAGNOSIS — M25562 Pain in left knee: Secondary | ICD-10-CM

## 2022-04-10 DIAGNOSIS — M25561 Pain in right knee: Secondary | ICD-10-CM | POA: Diagnosis not present

## 2022-04-10 DIAGNOSIS — G8929 Other chronic pain: Secondary | ICD-10-CM | POA: Diagnosis not present

## 2022-04-10 DIAGNOSIS — M6281 Muscle weakness (generalized): Secondary | ICD-10-CM | POA: Diagnosis not present

## 2022-04-10 DIAGNOSIS — M5459 Other low back pain: Secondary | ICD-10-CM | POA: Diagnosis not present
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
## 2022-05-01 ENCOUNTER — Encounter: Payer: PPO | Admitting: Physical Therapy

## 2022-05-15 ENCOUNTER — Encounter: Payer: PPO | Admitting: Physical Therapy

## 2022-06-19 ENCOUNTER — Other Ambulatory Visit (HOSPITAL_COMMUNITY): Payer: Self-pay

## 2022-07-26 ENCOUNTER — Other Ambulatory Visit (HOSPITAL_COMMUNITY): Payer: Self-pay

## 2022-08-08 ENCOUNTER — Ambulatory Visit: Payer: PPO

## 2022-09-06 ENCOUNTER — Ambulatory Visit: Payer: PPO | Admitting: Internal Medicine

## 2022-09-06 ENCOUNTER — Other Ambulatory Visit: Payer: Self-pay

## 2022-09-06 ENCOUNTER — Other Ambulatory Visit (HOSPITAL_COMMUNITY): Payer: Self-pay

## 2022-09-06 ENCOUNTER — Encounter: Payer: Self-pay | Admitting: Internal Medicine

## 2022-09-06 VITALS — BP 138/67 | HR 65 | Temp 98.2°F | Ht 70.0 in | Wt 215.1 lb

## 2022-09-06 DIAGNOSIS — Z87891 Personal history of nicotine dependence: Secondary | ICD-10-CM

## 2022-09-06 DIAGNOSIS — M170 Bilateral primary osteoarthritis of knee: Secondary | ICD-10-CM | POA: Diagnosis not present

## 2022-09-06 DIAGNOSIS — I10 Essential (primary) hypertension: Secondary | ICD-10-CM | POA: Diagnosis not present

## 2022-09-06 DIAGNOSIS — I739 Peripheral vascular disease, unspecified: Secondary | ICD-10-CM | POA: Diagnosis not present

## 2022-09-06 DIAGNOSIS — Z9189 Other specified personal risk factors, not elsewhere classified: Secondary | ICD-10-CM

## 2022-09-06 DIAGNOSIS — F5101 Primary insomnia: Secondary | ICD-10-CM

## 2022-09-06 MED ORDER — ZOLPIDEM TARTRATE 5 MG PO TABS
5.0000 mg | ORAL_TABLET | Freq: Every evening | ORAL | 1 refills | Status: DC | PRN
Start: 2022-09-17 — End: 2023-02-07
  Filled 2022-09-06 – 2022-09-12 (×2): qty 90, 90d supply, fill #0

## 2022-09-06 MED ORDER — ROSUVASTATIN CALCIUM 10 MG PO TABS
10.0000 mg | ORAL_TABLET | Freq: Every day | ORAL | 1 refills | Status: DC
  Filled 2022-09-06: qty 90, 90d supply, fill #0

## 2022-09-06 NOTE — Assessment & Plan Note (Signed)
No caudication symptoms.  Continue low dose aspirin and statin.

## 2022-09-06 NOTE — Assessment & Plan Note (Signed)
Ambein 5mg  continues to be moderately effective.  Continues good sleep hygiene practices.

## 2022-09-06 NOTE — Assessment & Plan Note (Signed)
Well controlled on Azor+ HCTZ

## 2022-09-06 NOTE — Assessment & Plan Note (Signed)
Severe OA of right knee, follow up PRN with orthopedics. Current managing with OTC Voltaren gel and Ibuprofen.

## 2022-09-06 NOTE — Progress Notes (Signed)
Established Patient Office Visit  Subjective   Patient ID: Scott Burgess, male    DOB: Feb 06, 1950  Age: 72 y.o. MRN: [id number]  Chief Complaint  Patient presents with   Follow-up    Right knee pain.   Scott Burgess is here to follow-up hypertension, PAD and osteoarthritis of his right knee.  Since our last visit he did see orthopedics and was referred to physical therapy.  He notes physical therapy has helped a lot especially with his low back.  Help some with his knee he is also been using a knee sleeve as well as Voltaren gel and Vicks vapor rub in addition to Tylenol and occasional over-the-counter ibuprofen and finds this to be effective he is maintaining mobility.  He understands that he can follow-up with orthopedics for potential Synvisc injections if he needs.  He has been taking his blood pressure medication regularly and has had no issues.  He is moving to a new apartment and is rather excited about this.     Objective:     BP 138/67 (BP Location: Right Arm, Patient Position: Sitting, Cuff Size: Large)   Pulse 65   Temp 98.2 F (36.8 C) (Oral)   Ht 5\' 10"  (1.778 m)   Wt 215 lb 1.6 oz (97.6 kg)   SpO2 99% Comment: RA  BMI 30.86 kg/m  BP Readings from Last 3 Encounters:  09/06/22 138/67  02/22/22 127/64  11/21/21 (!) 171/82   Wt Readings from Last 3 Encounters:  09/06/22 215 lb 1.6 oz (97.6 kg)  03/05/22 220 lb (99.8 kg)  02/22/22 220 lb 12.8 oz (100.2 kg)      Physical Exam Vitals and nursing note reviewed.  Constitutional:      Appearance: Normal appearance. He is obese.  Pulmonary:     Effort: Pulmonary effort is normal.  Musculoskeletal:     Right lower leg: No edema.     Left lower leg: No edema.  Neurological:     Mental Status: He is alert.  Psychiatric:        Mood and Affect: Mood normal.        Behavior: Behavior normal.      No results found for any visits on 09/06/22.  Last CBC Lab Results  Component Value Date   WBC 5.6 04/16/2019    HGB 14.9 04/16/2019   HCT 44.6 04/16/2019   MCV 92 04/16/2019   MCH 30.6 04/16/2019   RDW 12.8 04/16/2019   PLT 299 04/16/2019   Last metabolic panel Lab Results  Component Value Date   GLUCOSE 84 11/21/2021   NA 143 11/21/2021   K 3.8 11/21/2021   CL 105 11/21/2021   CO2 21 11/21/2021   BUN 15 11/21/2021   CREATININE 1.01 11/21/2021   EGFR 80 11/21/2021   CALCIUM 9.5 11/21/2021   PROT 7.7 04/16/2019   ALBUMIN 4.9 (H) 04/16/2019   LABGLOB 2.8 04/16/2019   AGRATIO 1.8 04/16/2019   BILITOT 0.6 04/16/2019   ALKPHOS 63 04/16/2019   AST 34 04/16/2019   ALT 31 04/16/2019   Last lipids Lab Results  Component Value Date   CHOL 185 11/21/2021   HDL 33 (L) 11/21/2021   LDLCALC 126 (H) 11/21/2021   TRIG 145 11/21/2021   CHOLHDL 5.6 (H) 11/21/2021   Last hemoglobin A1c Lab Results  Component Value Date   HGBA1C 5.5 07/29/2017      The 10-year ASCVD risk score (Arnett DK, et al., 2019) is: 24.4%    Assessment & Plan:  Problem List Items Addressed This Visit       Cardiovascular and Mediastinum   Essential hypertension - Primary (Chronic)    Well controlled on Azor+ HCTZ      Relevant Medications   rosuvastatin (CRESTOR) 10 MG tablet   Other Relevant Orders   Lipid Profile   CMP14 + Anion Gap   PAD (peripheral artery disease) (HCC) (Chronic)    No caudication symptoms.  Continue low dose aspirin and statin.      Relevant Medications   rosuvastatin (CRESTOR) 10 MG tablet   Other Relevant Orders   Lipid Profile   CMP14 + Anion Gap     Musculoskeletal and Integument   Osteoarthritis of knees, bilateral (Chronic)    Severe OA of right knee, follow up PRN with orthopedics. Current managing with OTC Voltaren gel and Ibuprofen.      Relevant Medications   ibuprofen (ADVIL) 200 MG tablet     Other   Insomnia (Chronic)    Ambein 5mg  continues to be moderately effective.  Continues good sleep hygiene practices.      Relevant Medications   zolpidem  (AMBIEN) 5 MG tablet (Start on 09/17/2022)   Candidate for statin therapy due to risk of future cardiovascular event (Chronic)   Relevant Medications   rosuvastatin (CRESTOR) 10 MG tablet    Return in about 6 months (around 03/09/2023).    Gust Rung, DO

## 2022-09-07 LAB — CMP14 + ANION GAP
ALT: 25 IU/L (ref 0–44)
AST: 27 IU/L (ref 0–40)
Albumin: 4.6 g/dL (ref 3.8–4.8)
Alkaline Phosphatase: 67 IU/L (ref 44–121)
Anion Gap: 14 mmol/L (ref 10.0–18.0)
BUN/Creatinine Ratio: 12 (ref 10–24)
BUN: 13 mg/dL (ref 8–27)
Bilirubin Total: 0.4 mg/dL (ref 0.0–1.2)
CO2: 22 mmol/L (ref 20–29)
Calcium: 9.6 mg/dL (ref 8.6–10.2)
Chloride: 102 mmol/L (ref 96–106)
Creatinine, Ser: 1.1 mg/dL (ref 0.76–1.27)
Globulin, Total: 2.9 g/dL (ref 1.5–4.5)
Glucose: 93 mg/dL (ref 70–99)
Potassium: 3.8 mmol/L (ref 3.5–5.2)
Sodium: 138 mmol/L (ref 134–144)
Total Protein: 7.5 g/dL (ref 6.0–8.5)
eGFR: 72 mL/min/{1.73_m2} (ref >59)

## 2022-09-07 LAB — LIPID PANEL
Chol/HDL Ratio: 5.9 ratio — ABNORMAL HIGH (ref 0.0–5.0)
Cholesterol, Total: 188 mg/dL (ref 100–199)
HDL: 32 mg/dL — ABNORMAL LOW (ref >39)
LDL Chol Calc (NIH): 140 mg/dL — ABNORMAL HIGH (ref 0–99)
Triglycerides: 84 mg/dL (ref 0–149)
VLDL Cholesterol Cal: 16 mg/dL (ref 5–40)

## 2022-09-10 ENCOUNTER — Other Ambulatory Visit (HOSPITAL_COMMUNITY): Payer: Self-pay

## 2022-09-10 MED ORDER — ROSUVASTATIN CALCIUM 20 MG PO TABS
20.0000 mg | ORAL_TABLET | Freq: Every day | ORAL | 3 refills | Status: DC
  Filled 2022-09-10: qty 90, 90d supply, fill #0

## 2022-09-10 NOTE — Assessment & Plan Note (Signed)
LDL remains elevated, will increase crestor to 20mg  daily

## 2022-09-10 NOTE — Addendum Note (Signed)
Addended by: Gust Rung on: 09/10/2022 10:33 AM   Modules accepted: Orders

## 2022-09-12 ENCOUNTER — Other Ambulatory Visit (HOSPITAL_COMMUNITY): Payer: Self-pay

## 2022-09-12 MED ORDER — INFLUENZA VIRUS VACC SPLIT PF (FLUZONE) 0.5 ML IM SUSY
0.5000 mL | PREFILLED_SYRINGE | INTRAMUSCULAR | 0 refills | Status: DC
  Filled 2022-09-12: qty 0.5, 1d supply, fill #0

## 2022-09-18 ENCOUNTER — Encounter: Payer: Self-pay | Admitting: Internal Medicine

## 2022-09-19 ENCOUNTER — Ambulatory Visit: Payer: PPO

## 2022-09-19 NOTE — Progress Notes (Signed)
This encounter was created in error - please disregard. No answer from patient.  I left a detailed message for pt to call office to reschedule visit.

## 2022-09-20 ENCOUNTER — Ambulatory Visit: Payer: PPO

## 2022-09-26 ENCOUNTER — Other Ambulatory Visit (HOSPITAL_COMMUNITY): Payer: Self-pay

## 2022-09-26 MED ORDER — COVID-19 MRNA VAC-TRIS(PFIZER) 30 MCG/0.3ML IM SUSY
0.3000 mL | PREFILLED_SYRINGE | Freq: Once | INTRAMUSCULAR | 0 refills | Status: AC
  Filled 2022-09-26: qty 0.3, 1d supply, fill #0

## 2022-11-20 ENCOUNTER — Other Ambulatory Visit (HOSPITAL_COMMUNITY): Payer: Self-pay

## 2023-02-06 ENCOUNTER — Telehealth: Payer: Self-pay | Admitting: Internal Medicine

## 2023-02-06 DIAGNOSIS — Z9189 Other specified personal risk factors, not elsewhere classified: Secondary | ICD-10-CM

## 2023-02-06 DIAGNOSIS — F5101 Primary insomnia: Secondary | ICD-10-CM

## 2023-02-06 DIAGNOSIS — I10 Essential (primary) hypertension: Secondary | ICD-10-CM

## 2023-02-06 NOTE — Telephone Encounter (Signed)
Upcoming appointment  Date: 03/21/2023 Status: Sch  Time: 9:15 AM Length: 30  Visit Type: OPEN ESTABLISHED [726] Copay: $0.00  Provider: Gust Rung, DO      amLODipine-olmesartan (AZOR) 5-20 MG tablet  hydrochlorothiazide (MICROZIDE) 12.5 MG capsule   zolpidem (AMBIEN) 5 MG table   rosuvastatin (CRESTOR) 20 MG tablet   ibuprofen (ADVIL) 800 MG tablet 03/21/22 09/06/22    [facility]   Pharmacy in [place], [place] Located in: [place] Address: [address] [place], [place], [place] [address] Phone: [phone]

## 2023-02-07 MED ORDER — IBUPROFEN 800 MG PO TABS: 800.0000 mg | ORAL_TABLET | Freq: Three times a day (TID) | ORAL | 0 refills | Status: DC | PRN

## 2023-02-07 MED ORDER — AMLODIPINE-OLMESARTAN 5-20 MG PO TABS: 1.0000 | ORAL_TABLET | Freq: Every day | ORAL | 3 refills | Status: AC

## 2023-02-07 MED ORDER — ROSUVASTATIN CALCIUM 20 MG PO TABS: 20.0000 mg | ORAL_TABLET | Freq: Every day | ORAL | 3 refills | Status: AC

## 2023-02-07 MED ORDER — HYDROCHLOROTHIAZIDE 12.5 MG PO CAPS: 12.5000 mg | ORAL_CAPSULE | Freq: Every day | ORAL | 3 refills | Status: DC

## 2023-02-07 MED ORDER — ZOLPIDEM TARTRATE 5 MG PO TABS: 5.0000 mg | ORAL_TABLET | Freq: Every evening | ORAL | 1 refills | Status: DC | PRN

## 2023-02-07 NOTE — Telephone Encounter (Signed)
Patient is requesting refills to be sent to [facility] on [address], this pharmacy is closer to him.

## 2023-03-21 ENCOUNTER — Ambulatory Visit: Payer: PPO | Admitting: Internal Medicine

## 2023-03-21 ENCOUNTER — Encounter: Payer: Self-pay | Admitting: Internal Medicine

## 2023-03-21 VITALS — BP 137/72 | HR 88 | Temp 97.8°F | Ht 70.0 in | Wt 223.7 lb

## 2023-03-21 DIAGNOSIS — F5101 Primary insomnia: Secondary | ICD-10-CM

## 2023-03-21 DIAGNOSIS — M170 Bilateral primary osteoarthritis of knee: Secondary | ICD-10-CM | POA: Diagnosis not present

## 2023-03-21 DIAGNOSIS — I10 Essential (primary) hypertension: Secondary | ICD-10-CM | POA: Diagnosis not present

## 2023-03-21 DIAGNOSIS — I739 Peripheral vascular disease, unspecified: Secondary | ICD-10-CM

## 2023-03-21 MED ORDER — TRIAMCINOLONE ACETONIDE 40 MG/ML IJ SUSP
40.0000 mg | Freq: Once | INTRAMUSCULAR | Status: AC
  Administered 2023-03-21: 40 mg via INTRA_ARTICULAR

## 2023-03-21 MED ORDER — LIDOCAINE HCL (PF) 1 % IJ SOLN
2.0000 mL | Freq: Once | INTRAMUSCULAR | Status: AC
  Administered 2023-03-21: 2 mL

## 2023-03-21 NOTE — Assessment & Plan Note (Signed)
 Continues to have good benefit from 5 mg of Ambien

## 2023-03-21 NOTE — Assessment & Plan Note (Signed)
 Blood pressure well-controlled we will continue olmesartan amlodipine and hydrochlorothiazide

## 2023-03-21 NOTE — Assessment & Plan Note (Addendum)
 Offered referral back to orthopedics however he is remains concerned about cost and wanted steroid injections in office with me.  I performed bilateral steroid injections today and asked him to call me back if we need to do this again before his next visit.  May continue occasional use of ibuprofen .  ADDENDUM:Patient now has medicaid supplement and wants orthopedic referral placed.  I have placed this referral 07/09/23

## 2023-03-21 NOTE — Progress Notes (Addendum)
 Established Patient Office Visit  Subjective   Patient ID: Scott Burgess, male    DOB: Dec 22, 1950  Age: 73 y.o. MRN: [id number]  Chief Complaint  Patient presents with   Medical Management of Chronic Issues    Arthritis both knees.   Scott Burgess returns today for follow-up of his chronic osteoarthritis of his knees as well as hypertension and hyperlipidemia.  He has been taking his blood pressure medications and cholesterol medication as directed and has had no issues he does not pay anything for these medications.  He does have a small cost associated with Ambien  and ibuprofen .  Cost remains a large concern of his.  He is enjoying his new apartment.  Is able to walk about 3 blocks to [employer].  A few months ago he was able to attend daycare on [employer] [place] with his family it was difficult to walk up the stairs.  He has severe tricompartmental osteoarthritis of his right knee and mild osteoarthritis of his left knee however since that game he his left knee has been bothering him quite a bit.  He has a large knee brace with metal stabilizers but he cannot use it all the time and is wondering about a more flexible knee brace.  He has seen orthopedics a little over a year ago offered gel injections but cost was about 150 per knee and this was prohibitive.  Interested in corticosteroid injections here he thinks that he did get at least 3 months of benefit in the past.    Objective:     BP 137/72 (BP Location: Left Arm, Patient Position: Sitting, Cuff Size: Large)   Pulse 88   Temp 97.8 F (36.6 C) (Oral)   Ht 5' 10 (1.778 m)   Wt 223 lb 11.2 oz (101.5 kg)   SpO2 99% Comment: RA  BMI 32.10 kg/m  BP Readings from Last 3 Encounters:  03/21/23 137/72  09/06/22 138/67  02/22/22 127/64   Wt Readings from Last 3 Encounters:  03/21/23 223 lb 11.2 oz (101.5 kg)  09/06/22 215 lb 1.6 oz (97.6 kg)  03/05/22 220 lb (99.8 kg)      Physical Exam Vitals and nursing note reviewed.   Constitutional:      Appearance: Normal appearance.  Pulmonary:     Effort: Pulmonary effort is normal.  Musculoskeletal:     Right knee: Crepitus present. No swelling, effusion or erythema. Tenderness present over the medial joint line and lateral joint line.     Left knee: Crepitus present. No swelling, effusion or erythema. Tenderness present over the medial joint line and lateral joint line.  Neurological:     Mental Status: He is alert.  Psychiatric:        Mood and Affect: Mood normal.        Behavior: Behavior normal.      No results found for any visits on 03/21/23.  Last metabolic panel Lab Results  Component Value Date   GLUCOSE 93 09/06/2022   NA 138 09/06/2022   K 3.8 09/06/2022   CL 102 09/06/2022   CO2 22 09/06/2022   BUN 13 09/06/2022   CREATININE 1.10 09/06/2022   EGFR 72 09/06/2022   CALCIUM  9.6 09/06/2022   PROT 7.5 09/06/2022   ALBUMIN 4.6 09/06/2022   LABGLOB 2.9 09/06/2022   AGRATIO 1.8 04/16/2019   BILITOT 0.4 09/06/2022   ALKPHOS 67 09/06/2022   AST 27 09/06/2022   ALT 25 09/06/2022   Last lipids Lab Results  Component Value  Date   CHOL 188 09/06/2022   HDL 32 (L) 09/06/2022   LDLCALC 140 (H) 09/06/2022   TRIG 84 09/06/2022   CHOLHDL 5.9 (H) 09/06/2022   Last hemoglobin A1c Lab Results  Component Value Date   HGBA1C 5.5 07/29/2017      The 10-year ASCVD risk score (Arnett DK, et al., 2019) is: 24.4%    Assessment & Plan:   Problem List Items Addressed This Visit       Cardiovascular and Mediastinum   Essential hypertension (Chronic)   Blood pressure well-controlled we will continue olmesartan  amlodipine  and hydrochlorothiazide       Relevant Orders   Lipid Profile (Completed)   PAD (peripheral artery disease) (HCC) - Primary (Chronic)   Recheck lipid panel after dose increase to 20 mg Crestor  and titrate accordingly.      Relevant Orders   Lipid Profile (Completed)     Musculoskeletal and Integument   Osteoarthritis  of knees, bilateral (Chronic)   Offered referral back to orthopedics however he is remains concerned about cost and wanted steroid injections in office with me.  I performed bilateral steroid injections today and asked him to call me back if we need to do this again before his next visit.  May continue occasional use of ibuprofen .  ADDENDUM:Patient now has medicaid supplement and wants orthopedic referral placed.  I have placed this referral 07/09/23      Relevant Orders   Ambulatory Referral for DME   Ambulatory referral to Orthopedic Surgery     Other   Insomnia (Chronic)   Continues to have good benefit from 5 mg of Ambien        Return in about 6 months (around 09/21/2023).    Scott JAYSON Eastern, DO  PROCEDURE NOTE  PROCEDURE: right knee joint steroid injection.  PREOPERATIVE DIAGNOSIS: Osteoarthritis of the bilateral knee.  POSTOPERATIVE DIAGNOSIS: Osteoarthritis of the bilateral knee.  PROCEDURE: The patient was apprised of the risks and the benefits of the procedure and informed consent was obtained, as witnessed by [facility]. Time-out procedure was performed, with confirmation of the patient's name, date of birth, and correct identification of the right knee to be injected. The patient's knee was then marked at the appropriate site for injection placement. The knee was sterilely prepped with Betadine. A 40 mg (1 milliliter) solution of Kenalog  was drawn up into a 5 mL syringe with a 2 mL of 1% lidocaine . The patient was injected with a 25-gauge needle at the anteriolateral aspect of his right flexed knee. There were no complications. The patient tolerated the procedure well. There was minimal bleeding. The patient was instructed to ice his knee upon leaving clinic and refrain from overuse over the next 3 days. The patient was instructed to go to the emergency room with any usual pain, swelling, or redness occurred in the injected area. The patient was given a followup appointment to  evaluate response to the injection to his increased range of motion and reduction of pain.  PROCEDURE NOTE  PROCEDURE: left knee joint steroid injection.  PREOPERATIVE DIAGNOSIS: Osteoarthritis of the bilateral knee.  POSTOPERATIVE DIAGNOSIS: Osteoarthritis of the bilateral knee.  PROCEDURE: The patient was apprised of the risks and the benefits of the procedure and informed consent was obtained, as witnessed by [facility]. Time-out procedure was performed, with confirmation of the patient's name, date of birth, and correct identification of the left knee to be injected. The patient's knee was then marked at the appropriate site for injection placement. The knee  was sterilely prepped with Betadine. A 40 mg (1 milliliter) solution of Kenalog  was drawn up into a 5 mL syringe with a 2 mL of 1% lidocaine . The patient was injected with a 25-gauge needle at the anteriolateral aspect of his left flexed knee. There were no complications. The patient tolerated the procedure well. There was minimal bleeding. The patient was instructed to ice his knee upon leaving clinic and refrain from overuse over the next 3 days. The patient was instructed to go to the emergency room with any usual pain, swelling, or redness occurred in the injected area. The patient was given a followup appointment to evaluate response to the injection to his increased range of motion and reduction of pain.

## 2023-03-21 NOTE — Assessment & Plan Note (Signed)
 Recheck lipid panel after dose increase to 20 mg Crestor and titrate accordingly.

## 2023-03-22 LAB — LIPID PANEL
Chol/HDL Ratio: 3.5 ratio (ref 0.0–5.0)
Cholesterol, Total: 121 mg/dL (ref 100–199)
HDL: 35 mg/dL — ABNORMAL LOW (ref >39)
LDL Chol Calc (NIH): 71 mg/dL (ref 0–99)
Triglycerides: 74 mg/dL (ref 0–149)
VLDL Cholesterol Cal: 15 mg/dL (ref 5–40)

## 2023-04-09 ENCOUNTER — Telehealth: Payer: Self-pay

## 2023-04-09 NOTE — Telephone Encounter (Signed)
 Patient called and wants to know if the orthopaedic doctor that he was referred to is in his network. Please respond back via MyChart.  Gino Garrabrant N. Reymundo Poll, LPN [facility] Annual Wellness Team Direct Dial: [phone]

## 2023-04-10 ENCOUNTER — Ambulatory Visit: Payer: PPO

## 2023-04-10 VITALS — Ht 64.0 in | Wt 225.0 lb

## 2023-04-10 DIAGNOSIS — Z0000 Encounter for general adult medical examination without abnormal findings: Secondary | ICD-10-CM

## 2023-04-10 NOTE — Progress Notes (Signed)
 Because this visit was a virtual/telehealth visit,  certain criteria was not obtained, such a blood pressure, CBG if applicable, and timed get up and go. Any medications not marked as "taking" were not mentioned during the medication reconciliation part of the visit. Any vitals not documented were not able to be obtained due to this being a telehealth visit or patient was unable to self-report a recent blood pressure reading due to a lack of equipment at home via telehealth. Vitals that have been documented are verbally provided by the patient.   Subjective:   Scott Burgess is a 73 y.o. who presents for a Medicare Wellness preventive visit.  Visit Complete: Virtual I connected with  Montez Morita on 04/10/23 by a audio enabled telemedicine application and verified that I am speaking with the correct person using two identifiers.  Patient Location: Home  Provider Location: Office/Clinic  I discussed the limitations of evaluation and management by telemedicine. The patient expressed understanding and agreed to proceed.  Vital Signs: Because this visit was a virtual/telehealth visit, some criteria may be missing or patient reported. Any vitals not documented were not able to be obtained and vitals that have been documented are patient reported.  VideoDeclined- This patient declined Librarian, academic. Therefore the visit was completed with audio only.  Persons Participating in Visit: Patient.  AWV Questionnaire: No: Patient Medicare AWV questionnaire was not completed prior to this visit.  Cardiac Risk Factors include: advanced age (>48men, >56 women);hypertension;male gender;obesity (BMI >30kg/m2);dyslipidemia     Objective:    Today's Vitals   04/10/23 1439  Weight: 225 lb (102.1 kg)  Height: 5\' 4"  (1.626 m)  PainSc: 1    Body mass index is 38.62 kg/m.     04/10/2023    2:43 PM 09/06/2022    9:00 AM 08/08/2022    1:15 PM 03/13/2022    3:29 PM  02/22/2022    9:19 AM 11/21/2021    2:32 PM 11/21/2021    2:11 PM  Advanced Directives  Does Patient Have a Medical Advance Directive? Yes No No No No Yes Yes  Type of Estate agent of [place];Living will     Healthcare Power of [employer] Power of Attorney  Copy of Healthcare Power of Attorney in Chart? No - copy requested        Would patient like information on creating a medical advance directive?  No - Patient declined No - Patient declined No - Patient declined No - Patient declined      Current Medications (verified) Outpatient Encounter Medications as of 04/10/2023  Medication Sig   amLODipine-olmesartan (AZOR) 5-20 MG tablet Take 1 tablet by mouth daily.   diclofenac Sodium (VOLTAREN) 1 % GEL Apply 4 g topically 4 (four) times daily.   hydrochlorothiazide (MICROZIDE) 12.5 MG capsule Take 1 capsule (12.5 mg total) by mouth daily.   ibuprofen (ADVIL) 800 MG tablet Take 1 tablet (800 mg total) by mouth every 8 (eight) hours as needed.   rosuvastatin (CRESTOR) 20 MG tablet Take 1 tablet (20 mg total) by mouth daily.   zolpidem (AMBIEN) 5 MG tablet Take 1 tablet (5 mg total) by mouth before bedtime as needed.   ASPIRIN PO Take 180 mg by mouth daily. Swallow whole. (Patient not taking: Reported on 04/10/2023)   [DISCONTINUED] amLODIPine-Valsartan-HCTZ 5-160-12.5 MG TABS Take 1 tablet by mouth daily.   No facility-administered encounter medications on file as of 04/10/2023.    Allergies (verified) Patient has  no known allergies.   History: Past Medical History:  Diagnosis Date   Hepatitis C, chronic (HCC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 12 weeks sustained viral response was undetectable.  Will need ongoing screening for HCC recommended by [place] q 1 year, patient requests longer interval due to cost.     Hyperlipidemia    Hypertension    Osteoarthritis of  right knee 02/11/2014   Personal history of colonic adenoma 03/17/2012   Pneumonia    STD (male)    Past Surgical History:  Procedure Laterality Date   COLONOSCOPY  03/10/12   [facility]   HERNIA REPAIR     TONSILLECTOMY     Family History  Problem Relation Age of Onset   Diabetes Mother    Diabetes Maternal Grandmother    Colon cancer Neg Hx    Colon polyps Neg Hx    Esophageal cancer Neg Hx    Rectal cancer Neg Hx    Stomach cancer Neg Hx    Social History   Socioeconomic History   Marital status: Single    Spouse name: Not on file   Number of children: 3   Years of education: Not on file   Highest education level: Not on file  Occupational History   Occupation: Multimedia programmer: [employer]  Tobacco Use   Smoking status: Former    Current packs/day: 0.00    Types: Cigarettes    Quit date: 01/15/2005    Years since quitting: 18.2   Smokeless tobacco: Never  Vaping Use   Vaping status: Never Used  Substance and Sexual Activity   Alcohol use: No    Alcohol/week: 0.0 standard drinks of alcohol   Drug use: No   Sexual activity: Not on file  Other Topics Concern   Not on file  Social History Narrative   ** Merged History Encounter **       Social Drivers of Health   Financial Resource Strain: Low Risk  (04/10/2023)   Overall Financial Resource Strain (CARDIA)    Difficulty of Paying Living Expenses: Not very hard  Food Insecurity: No Food Insecurity (04/10/2023)   Hunger Vital Sign    Worried About Running Out of Food in the Last Year: Never true    Ran Out of Food in the Last Year: Never true  Transportation Needs: No Transportation Needs (04/10/2023)   PRAPARE - Administrator, Civil Service (Medical): No    Lack of Transportation (Non-Medical): No  Physical Activity: Insufficiently Active (04/10/2023)   Exercise Vital Sign    Days of Exercise per Week: 7 days    Minutes of Exercise per Session: 20 min  Stress: No Stress Concern Present  (04/10/2023)   [employer] of Occupational Health - Occupational Stress Questionnaire    Feeling of Stress : Not at all  Social Connections: Socially Isolated (04/10/2023)   Social Connection and Isolation Panel [NHANES]    Frequency of Communication with Friends and Family: More than three times a week    Frequency of Social Gatherings with Friends and Family: Twice a week    Attends Religious Services: Never    Database administrator or Organizations: No    Attends Banker Meetings: Never    Marital Status: Never married    Tobacco Counseling Counseling given: Not Answered  Clinical Intake:  Pre-visit preparation completed: Yes  Pain : No/denies pain Pain Score: 1  Pain Type: Chronic pain Pain Location: Knee Pain Orientation: Left, Right Pain Descriptors / Indicators: Aching, Constant Pain Onset: More than a month ago Pain Frequency: Constant Pain Relieving Factors: Voltaren gel, ice swelling  Pain Relieving Factors: Voltaren gel, ice swelling  BMI - recorded: 38.62 Nutritional Status: BMI > 30  Obese Nutritional Risks: None Diabetes: No  Lab Results  Component Value Date   HGBA1C 5.5 07/29/2017   HGBA1C  12/09/2008    5.5 (NOTE) The ADA recommends the following therapeutic goal for glycemic control related to Hgb A1c measurement: Goal of therapy: <6.5 Hgb A1c  Reference: American Diabetes Association: Clinical Practice Recommendations 2010, Diabetes Care, 2010, 33: (Suppl  1).     How often do you need to have someone help you when you read instructions, pamphlets, or other written materials from your doctor or pharmacy?: 1 - Never  Interpreter Needed?: No  Information entered by :: Imani Fiebelkorn N. Basheer Molchan, LPN.   Activities of Daily Living     04/10/2023    2:45 PM 09/06/2022    8:59 AM  In your present state of health, do you have any difficulty performing the following activities:  Hearing? 0 0  Vision? 0 0  Difficulty concentrating or  making decisions? 0 0  Walking or climbing stairs? 0 0  Dressing or bathing? 0 0  Doing errands, shopping? 0 0  Preparing Food and eating ? N   Using the Toilet? N   In the past six months, have you accidently leaked urine? N   Do you have problems with loss of bowel control? N   Managing your Medications? N   Managing your Finances? N   Housekeeping or managing your Housekeeping? N     Patient Care Team: Gust Rung, DO as PCP - General (Internal Medicine) Conley Rolls, [facility] [place], [place] as Referring Physician (Optometry)  Indicate any recent Medical Services you may have received from other than [facility] providers in the past year (date may be approximate).     Assessment:   This is a routine wellness examination for [place].  Hearing/Vision screen Hearing Screening - Comments:: Denies hearing difficulties.  Vision Screening - Comments:: Wears rx glasses - up to date with routine eye exams with [facility]    Goals Addressed             This Visit's Progress    To do my exercises everyday for 30 minutes and walking 2 miles.  I also use a power leg machine.         Depression Screen     04/10/2023    2:44 PM 03/21/2023    8:35 AM 09/06/2022    8:58 AM 08/08/2022    1:13 PM 02/22/2022    9:19 AM 11/21/2021    2:32 PM 11/21/2021    2:14 PM  PHQ 2/9 Scores  PHQ - 2 Score 0 0 0 0 0 0 0  PHQ- 9 Score 0          Fall Risk     04/10/2023    2:44 PM 03/21/2023    8:35 AM 09/06/2022    8:58 AM 02/22/2022    9:19 AM 11/21/2021    2:33 PM  Fall Risk   Falls in the past year? 0 0 0 0 0  Number falls in past yr: 0 0 0 0 0  Injury with Fall? 0 0  0 0 0  Risk for fall due to : No Fall Risks No Fall Risks No Fall Risks Impaired mobility No Fall Risks  Follow up Falls prevention discussed;Falls evaluation completed Falls prevention discussed Falls evaluation completed;Falls prevention discussed Falls evaluation completed;Falls prevention discussed Falls evaluation completed;Falls prevention  discussed    MEDICARE RISK AT HOME:  Medicare Risk at Home Any stairs in or around the home?: No If so, are there any without handrails?: No Home free of loose throw rugs in walkways, pet beds, electrical cords, etc?: Yes Adequate lighting in your home to reduce risk of falls?: Yes Life alert?: No Use of a cane, walker or w/c?: No Grab bars in the bathroom?: No Shower chair or bench in shower?: No Elevated toilet seat or a handicapped toilet?: No  TIMED UP AND GO:  Was the test performed?  No  Cognitive Function: 6CIT completed    04/10/2023    2:45 PM  MMSE - Mini Mental State Exam  Not completed: Unable to complete        04/10/2023    2:45 PM 08/08/2022    1:16 PM 11/21/2021    2:33 PM  6CIT Screen  What Year? 0 points 0 points 0 points  What month? 0 points 0 points 0 points  What time? 0 points 0 points 0 points  Count back from 20 0 points 0 points 0 points  Months in reverse 0 points 0 points 0 points  Repeat phrase 0 points 0 points 0 points  Total Score 0 points 0 points 0 points    Immunizations Immunization History  Administered Date(s) Administered   Fluad Quad(high Dose 65+) 10/08/2019, 10/11/2020, 11/21/2021   Influenza, Seasonal, Injecte, Preservative Fre 12/21/2011, 09/12/2022   Influenza,inj,Quad PF,6+ Mos 03/19/2013, 12/16/2014, 11/28/2015, 11/06/2016, 10/03/2017   PFIZER(Purple Top)SARS-COV-2 Vaccination 02/06/2019, 10/20/2019   Pfizer(Comirnaty)Fall Seasonal Vaccine 12 years and older 09/26/2022   Pneumococcal Conjugate-13 01/31/2017   Pneumococcal Polysaccharide-23 08/25/2012, 08/14/2018   Td 03/21/2022   Tdap 12/21/2011    Screening Tests Health Maintenance  Topic Date Due   Zoster Vaccines- Shingrix (1 of 2) Never done   COVID-19 Vaccine (4 - 2024-25 season) 03/26/2023   Medicare Annual Wellness (AWV)  04/09/2024   Colonoscopy  05/25/2025   DTaP/Tdap/Td (3 - Td or Tdap) 03/20/2032   Pneumonia Vaccine 4+ Years old  Completed    INFLUENZA VACCINE  Completed   Hepatitis C Screening  Completed   HPV VACCINES  Aged Out    Health Maintenance  Health Maintenance Due  Topic Date Due   Zoster Vaccines- Shingrix (1 of 2) Never done   COVID-19 Vaccine (4 - 2024-25 season) 03/26/2023   Health Maintenance Items Addressed: Yes, Patient is due for Shingrix and Covid vaccines.  Additional Screening:  Vision Screening: Recommended annual ophthalmology exams for early detection of glaucoma and other disorders of the eye.  Dental Screening: Recommended annual dental exams for proper oral hygiene  Community Resource Referral / Chronic Care Management: CRR required this visit?  No   CCM required this visit?  No     Plan:     I have personally reviewed and noted the following in the patient's chart:   Medical and social history Use of alcohol, tobacco or illicit drugs  Current medications and supplements including opioid prescriptions. Patient is not currently taking opioid prescriptions. Functional ability and status Nutritional status Physical activity Advanced directives List of other physicians Hospitalizations, surgeries, and ER visits in previous 12 months Vitals Screenings  to include cognitive, depression, and falls Referrals and appointments  In addition, I have reviewed and discussed with patient certain preventive protocols, quality metrics, and best practice recommendations. A written personalized care plan for preventive services as well as general preventive health recommendations were provided to patient.     Mickeal Needy, LPN   7/32/2025   After Visit Summary: (Declined) Due to this being a telephonic visit, with patients personalized plan was offered to patient but patient Declined AVS at this time   Notes: Nothing significant to report at this time.

## 2023-04-10 NOTE — Patient Instructions (Signed)
 Scott Burgess , Thank you for taking time to come for your Medicare Wellness Visit. I appreciate your ongoing commitment to your health goals. Please review the following plan we discussed and let me know if I can assist you in the future.   Referrals/Orders/Follow-Ups/Clinician Recommendations: Yes; Keep maintaining your health by keeping your appointments with Dr. Carlynn Purl and any specialists that you may see.  Call us if you need anything.  Have a great year!!!!  This is a list of the screening recommended for you and due dates:  Health Maintenance  Topic Date Due   Zoster (Shingles) Vaccine (1 of 2) Never done   COVID-19 Vaccine (4 - 2024-25 season) 03/26/2023   Medicare Annual Wellness Visit  04/09/2024   Colon Cancer Screening  05/25/2025   DTaP/Tdap/Td vaccine (3 - Td or Tdap) 03/20/2032   Pneumonia Vaccine  Completed   Flu Shot  Completed   Hepatitis C Screening  Completed   HPV Vaccine  Aged Out    Advanced directives: (Copy Requested) Please bring a copy of your health care power of attorney and living will to the office to be added to your chart at your convenience. You can mail to [facility] [address] [place], [place] [address] or email to [email or web address]  Next Medicare Annual Wellness Visit scheduled for next year: Yes

## 2023-04-14 NOTE — Progress Notes (Signed)
 Internal Medicine Attending:  I reviewed the AWV findings of the medical professional who conducted the visit. I was present in the office suite and immediately available to provide assistance and direction throughout the time the service was provided.

## 2023-04-16 NOTE — Telephone Encounter (Signed)
 Copied from CRM [id number]. Topic: Referral - Status >> Apr 12, 2023 12:42 PM Antony Haste wrote: Reason for CRM: The patient wanted to let his PCP know that he will be unable to attend his [facility] visit for bilateral knee osteoarthritis, due to the $45 out-of-pocket expense which he cannot afford at this time. He states he would like to follow-up on this referral request to determine what he should do to have his knee brace installed. He also stated the left knee joint steroid injection he received on 03/06 has been extremely helpful and its still working for him. I provided the referral office's number to the patient as requested to cancel his appointment.  Callback [phone]   Spoke with the pt.  Pt verbalized understanding, that he will need to keep his Appointment with [facility] to find out what kind of Brace he will need on the following:  Encounter Information  Provider Department Encounter # Center  05/09/2023 1:30 PM Kirtland Bouchard, PA-C [facility]

## 2023-04-22 ENCOUNTER — Telehealth: Admitting: *Deleted

## 2023-04-22 NOTE — Telephone Encounter (Signed)
 Copied from CRM [id number]. Topic: Clinical - Medication Question >> Apr 22, 2023 12:23 PM Maree Krabbe H wrote: Reason for CRM: Patient called ands wants to get his rx refilled but he wants to let Dr. Mikey Bussing know that they will be terminating his rx drug cost and he is wanting to know how he can help with his drug cost. Patient is wanting Dr. Mikey Bussing to call him at [phone]. He stated that one of his medicines is filled 90 days at a time and he doesn't know what to do.

## 2023-04-22 NOTE — Telephone Encounter (Signed)
 RTC to patient states he has a call in to his insurance company to ask about Pharmacy benefits.  States the new insurance allows him to get back enough monies to cover his rent.  This is the reason he switched to them.  Patient to call the Clinics back after he talks with his [employer]

## 2023-04-22 NOTE — Telephone Encounter (Addendum)
 Will forward to Dr. Mikey Bussing and C. [employer].

## 2023-04-25 ENCOUNTER — Ambulatory Visit: Admitting: Physician Assistant

## 2023-05-08 ENCOUNTER — Other Ambulatory Visit (HOSPITAL_COMMUNITY): Payer: Self-pay

## 2023-05-08 ENCOUNTER — Other Ambulatory Visit: Payer: Self-pay | Admitting: Internal Medicine

## 2023-05-08 MED ORDER — IBUPROFEN 800 MG PO TABS
800.0000 mg | ORAL_TABLET | Freq: Three times a day (TID) | ORAL | 0 refills | Status: AC | PRN
  Filled 2023-05-08: qty 90, 30d supply, fill #0

## 2023-05-08 NOTE — Telephone Encounter (Signed)
 Copied from CRM [id number]. Topic: Clinical - Prescription Issue >> May 08, 2023 11:41 AM Scott Burgess wrote: Reason for CRM: Patient called in and wanted Dr. Adriane Albe to know that he can't afford some of his medicines, he paid $5 foambien  for a 90 day supply and his other medicines like crestor , and his two high blood pressure medicines but his ibuprofen  800 he couldn't refill because nobody called the pharmacy about it but the patient is concerned because he just doesn't have the money for it. Patients callback number is [phone]. I will put a request in for his ibuprofen .

## 2023-05-09 ENCOUNTER — Ambulatory Visit: Admitting: Physician Assistant

## 2023-05-20 ENCOUNTER — Other Ambulatory Visit (HOSPITAL_COMMUNITY): Payer: Self-pay

## 2023-05-22 ENCOUNTER — Ambulatory Visit: Admitting: Physician Assistant

## 2023-07-01 ENCOUNTER — Telehealth: Payer: Self-pay | Admitting: *Deleted

## 2023-07-01 NOTE — Telephone Encounter (Unsigned)
 RTC to patient unable to reach or leave a voicemail as it has not been set up.  Copied from CRM [id number]. Topic: General - Other >> Jun 28, 2023  1:19 PM Blair Bumpers wrote: Reason for CRM: Patient is requesting for Dr. Adriane Albe to give him a call. He states that he needs to speak with Dr. Adriane Albe about what he needs to do now as far as his appts since he now has Medicaid that will be effective on July 1st. Patient states he had to cancel previous appts with [facility] because he could not afford the $45 copays. Now that he will have Medicaid in July, he wants to know what does he need to do about getting appts with [facility] and other things as well per patient. CB #: [phone].

## 2023-07-01 NOTE — Telephone Encounter (Signed)
 Marigene Shoulder will you call Scott Burgess and let him know- That's good news, you should go ahead and call [facility] and ask for an appointment after July 1st, If you need a new referral let us  know and I will be happy to place a new referral

## 2023-07-09 NOTE — Addendum Note (Signed)
 Addended by: ROSAN DAYTON BROCKS on: 07/09/2023 04:17 PM   Modules accepted: Orders

## 2023-09-26 ENCOUNTER — Ambulatory Visit: Payer: Self-pay | Admitting: Student

## 2023-09-26 VITALS — BP 142/84 | HR 88 | Temp 97.8°F | Wt 224.0 lb

## 2023-09-26 DIAGNOSIS — Z79899 Other long term (current) drug therapy: Secondary | ICD-10-CM

## 2023-09-26 DIAGNOSIS — M170 Bilateral primary osteoarthritis of knee: Secondary | ICD-10-CM

## 2023-09-26 DIAGNOSIS — Z23 Encounter for immunization: Secondary | ICD-10-CM | POA: Diagnosis not present

## 2023-09-26 DIAGNOSIS — F5104 Psychophysiologic insomnia: Secondary | ICD-10-CM | POA: Diagnosis not present

## 2023-09-26 DIAGNOSIS — F5101 Primary insomnia: Secondary | ICD-10-CM

## 2023-09-26 DIAGNOSIS — I10 Essential (primary) hypertension: Secondary | ICD-10-CM

## 2023-09-26 DIAGNOSIS — Z87891 Personal history of nicotine dependence: Secondary | ICD-10-CM

## 2023-09-26 MED ORDER — HYDROCHLOROTHIAZIDE 25 MG PO TABS: 25.0000 mg | ORAL_TABLET | Freq: Every day | ORAL | 3 refills | Status: AC

## 2023-09-26 MED ORDER — ZOLPIDEM TARTRATE 5 MG PO TABS: 5.0000 mg | ORAL_TABLET | Freq: Every evening | ORAL | 1 refills | Status: AC | PRN

## 2023-09-26 NOTE — Progress Notes (Deleted)
   Established Patient Office Visit  Subjective   Patient ID: Scott Burgess, male    DOB: 1950-11-13  Age: 73 y.o. MRN: [id number]  Chief Complaint  Patient presents with  . routine checkup   . Medication Refill    Scott Burgess is a 73 y.o. who presents to the clinic for ***. Please see problem based assessment and plan for additional details.      Patient Active Problem List   Diagnosis Date Noted  . Sigmoid diverticulosis 06/10/2020  . PAD (peripheral artery disease) (HCC) 08/19/2018  . Low back pain 03/28/2018  . Poor dentition 06/06/2017  . Vitamin D  deficiency 08/30/2016  . Bilateral cataracts 08/04/2015  . Asymptomatic PVCs 12/17/2014  . Osteoarthritis of knees, bilateral 02/11/2014  . Nearsightedness 03/19/2013  . Insomnia 05/14/2012  . History of colonic polyps 03/17/2012  . History of hepatitis C 01/22/2012  . Erectile dysfunction 01/22/2012  . History of syphilis 12/21/2011  . Routine health maintenance 12/21/2011  . Candidate for statin therapy due to risk of future cardiovascular event 12/23/2008  . Essential hypertension 12/23/2008      Objective:     There were no vitals taken for this visit. BP Readings from Last 3 Encounters:  09/26/23 (!) 142/84  03/21/23 137/72  09/06/22 138/67   Wt Readings from Last 3 Encounters:  09/26/23 224 lb (101.6 kg)  04/10/23 225 lb (102.1 kg)  03/21/23 223 lb 11.2 oz (101.5 kg)      Physical Exam   No results found for any visits on 09/26/23.  Last metabolic panel Lab Results  Component Value Date   GLUCOSE 93 09/06/2022   NA 138 09/06/2022   K 3.8 09/06/2022   CL 102 09/06/2022   CO2 22 09/06/2022   BUN 13 09/06/2022   CREATININE 1.10 09/06/2022   EGFR 72 09/06/2022   CALCIUM  9.6 09/06/2022   PROT 7.5 09/06/2022   ALBUMIN 4.6 09/06/2022   LABGLOB 2.9 09/06/2022   AGRATIO 1.8 04/16/2019   BILITOT 0.4 09/06/2022   ALKPHOS 67 09/06/2022   AST 27 09/06/2022   ALT 25 09/06/2022   Last  lipids Lab Results  Component Value Date   CHOL 121 03/21/2023   HDL 35 (L) 03/21/2023   LDLCALC 71 03/21/2023   TRIG 74 03/21/2023   CHOLHDL 3.5 03/21/2023   Last hemoglobin A1c Lab Results  Component Value Date   HGBA1C 5.5 07/29/2017      The ASCVD Risk score (Arnett DK, et al., 2019) failed to calculate for the following reasons:   The valid total cholesterol range is 130 to 320 mg/dL    Assessment & Plan:   Problem List Items Addressed This Visit   None   No follow-ups on file.    Damien Lease, DO

## 2023-09-26 NOTE — Patient Instructions (Signed)
 Thank you, [name] for allowing us  to provide your care today. Today we discussed knee pain, insomnia, and high blood pressure.    For the knee pain: take tylenol  daily ( do NOT take more than 4,00 mg daily) -STOP taking ibuprofen  everyday, please take only as needed  -Continue the Voltaren gel  For the high blood pressure: -Please start hydrochlorothiazide  25 mg daily, you may use  2 tablets of 12.5 mg hydrochlorothiazide  until you are due for the next refill.  If you feel dizzy or have blood pressure of less than 100/60, please call then clinic and do not take your blood pressure medicine that day.   I have ordered the following labs for you:   Lab Orders         ToxAssure Select,+Antidepr,UR         Basic metabolic panel with GFR       I have ordered the following medication/changed the following medications:   Stop the following medications: Medications Discontinued During This Encounter  Medication Reason   zolpidem  (AMBIEN ) 5 MG tablet Reorder   ASPIRIN  PO    hydrochlorothiazide  (MICROZIDE ) 12.5 MG capsule      Start the following medications: Meds ordered this encounter  Medications   zolpidem  (AMBIEN ) 5 MG tablet    Sig: Take 1 tablet (5 mg total) by mouth before bedtime as needed.    Dispense:  90 tablet    Refill:  1   hydrochlorothiazide  (HYDRODIURIL ) 25 MG tablet    Sig: Take 1 tablet (25 mg total) by mouth daily.    Dispense:  90 tablet    Refill:  3     Follow up: 6 months    Should you have any questions or concerns please call the internal medicine clinic at [phone].     Please note that our late policy has changed.  If you are more than 15 minutes late to your appointment, you may be asked to reschedule your appointment.  Dr. Kandis, D.O. [facility]

## 2023-09-26 NOTE — Progress Notes (Unsigned)
 Established Patient Office Visit  Subjective   Patient ID: Scott Burgess, male    DOB: 1950-07-09  Age: 73 y.o. MRN: [id number]  Chief Complaint  Patient presents with   routine checkup     Requesting knee brace for knee pain.    Medication Refill    Scott Burgess is a 73 y.o. who presents to the clinic for a follow up of HTN and chronic OA pain. Please see problem based assessment and plan for additional details.   Patient Active Problem List   Diagnosis Date Noted   Sigmoid diverticulosis 06/10/2020   PAD (peripheral artery disease) (HCC) 08/19/2018   Low back pain 03/28/2018   Poor dentition 06/06/2017   Vitamin D  deficiency 08/30/2016   Bilateral cataracts 08/04/2015   Asymptomatic PVCs 12/17/2014   Osteoarthritis of knees, bilateral 02/11/2014   Nearsightedness 03/19/2013   Insomnia 05/14/2012   History of colonic polyps 03/17/2012   History of hepatitis C 01/22/2012   Erectile dysfunction 01/22/2012   History of syphilis 12/21/2011   Routine health maintenance 12/21/2011   Candidate for statin therapy due to risk of future cardiovascular event 12/23/2008   Essential hypertension 12/23/2008      Objective:     BP (!) 142/84 (BP Location: Left Arm, Patient Position: Sitting, Cuff Size: Large)   Pulse 88   Temp 97.8 F (36.6 C) (Oral)   Wt 224 lb (101.6 kg)   SpO2 94%   BMI 38.45 kg/m  BP Readings from Last 3 Encounters:  09/26/23 (!) 142/84  03/21/23 137/72  09/06/22 138/67   Wt Readings from Last 3 Encounters:  09/26/23 224 lb (101.6 kg)  04/10/23 225 lb (102.1 kg)  03/21/23 223 lb 11.2 oz (101.5 kg)      Physical Exam Vitals reviewed.  Constitutional:      General: He is not in acute distress.    Appearance: He is obese. He is not ill-appearing, toxic-appearing or diaphoretic.  Cardiovascular:     Rate and Rhythm: Normal rate and regular rhythm.     Heart sounds: No murmur heard. Pulmonary:     Effort: Pulmonary effort is normal. No  respiratory distress.     Breath sounds: Normal breath sounds. No wheezing or rales.  Musculoskeletal:     Right lower leg: No edema.     Left lower leg: No edema.     Comments: Left knee: crepitus on exam, non tender to palpate, ROM intact Right knee: crepitus on exam, non tender to palpate, ROM intact   Skin:    General: Skin is warm and dry.  Neurological:     Mental Status: He is alert.    Last metabolic panel Lab Results  Component Value Date   GLUCOSE 91 09/26/2023   NA 140 09/26/2023   K 3.8 09/26/2023   CL 101 09/26/2023   CO2 22 09/26/2023   BUN 8 09/26/2023   CREATININE 1.03 09/26/2023   EGFR 77 09/26/2023   CALCIUM  10.0 09/26/2023   PROT 7.5 09/06/2022   ALBUMIN 4.6 09/06/2022   LABGLOB 2.9 09/06/2022   AGRATIO 1.8 04/16/2019   BILITOT 0.4 09/06/2022   ALKPHOS 67 09/06/2022   AST 27 09/06/2022   ALT 25 09/06/2022   Last lipids Lab Results  Component Value Date   CHOL 121 03/21/2023   HDL 35 (L) 03/21/2023   LDLCALC 71 03/21/2023   TRIG 74 03/21/2023   CHOLHDL 3.5 03/21/2023   Last hemoglobin A1c Lab Results  Component Value Date  HGBA1C 5.5 07/29/2017      The ASCVD Risk score (Arnett DK, et al., 2019) failed to calculate for the following reasons:   The valid total cholesterol range is 130 to 320 mg/dL    Assessment & Plan:   Problem List Items Addressed This Visit       Cardiovascular and Mediastinum   Essential hypertension (Chronic)   Patient presents with a history of hypertension with a blood pressure today of 187/76, recheck of 142/84. Their hypertension is uncontrolled on a regimen of amlodipine  5 omlesartan 5-20, and hydrochlorothiazide  12.5 mg.  Prior BMP in August 2024, Scr was 1.10. Plan: -Will continue amlodipine  5 omlesartan 5-20, and increase hydrochlorothiazide  to 25 mg daily  -BMP collected      Relevant Medications   hydrochlorothiazide  (HYDRODIURIL ) 25 MG tablet   Other Relevant Orders   Basic metabolic panel with  GFR (Completed)     Musculoskeletal and Integument   Osteoarthritis of knees, bilateral (Chronic)   Patient has a history of bilateral knee osteoarthritis.  In the past he has completed physical therapy and steroid injections.  He saw orthopedics and was unable to afford follow-up copayments with his prior insurance.  He reports using daily ibuprofen  use, about 400 mg daily, and Voltaren gel.  Fortunately, patient's insurance has changed and he is willing to go back to the orthopedic surgeon to discuss surgery versus injections.  Plan: - Patient instructed to follow-up with orthopedic surgeon -Patient was informed to stop using daily ibuprofen .  We discussed using Tylenol  (less than 4,000 mg daily), and Voltaren gel.  Patient was instructed to only use ibuprofen  if Tylenol  and Voltaren gel are not controlling his pain -Patient requested knee brace to assist with stability when he is walking due to prior episodes of his knee giving out, DME for knee brace ordered (patient is aware that this will not resolve his underlying osteoarthritis and that he needs to follow-up with orthopedic surgery)      Relevant Orders   AMB REFERRAL FOR DME     Other   Insomnia (Chronic)   Patient ran out of Ambien  5 mg for about 1 month.  He tried over-the-counter melatonin and was unable to maintain sleep.  Will refill Ambien  5 mg due to melatonin not working. Plan: - Ambien  refilled -Contract updated, tox assure obtained      Relevant Medications   zolpidem  (AMBIEN ) 5 MG tablet   Other Relevant Orders   ToxAssure Select,+Antidepr,UR   Other Visit Diagnoses       Encounter for immunization    -  Primary   Relevant Orders   Flu vaccine HIGH DOSE PF(Fluzone Trivalent) (Completed)       Return in about 6 months (around 03/25/2024) for Chronic conditions .    Damien Lease, DO

## 2023-09-27 ENCOUNTER — Ambulatory Visit: Payer: Self-pay | Admitting: Student

## 2023-09-27 ENCOUNTER — Other Ambulatory Visit: Payer: Self-pay

## 2023-09-27 ENCOUNTER — Other Ambulatory Visit (HOSPITAL_COMMUNITY): Payer: Self-pay

## 2023-09-27 LAB — BASIC METABOLIC PANEL WITH GFR
BUN/Creatinine Ratio: 8 — ABNORMAL LOW (ref 10–24)
BUN: 8 mg/dL (ref 8–27)
CO2: 22 mmol/L (ref 20–29)
Calcium: 10 mg/dL (ref 8.6–10.2)
Chloride: 101 mmol/L (ref 96–106)
Creatinine, Ser: 1.03 mg/dL (ref 0.76–1.27)
Glucose: 91 mg/dL (ref 70–99)
Potassium: 3.8 mmol/L (ref 3.5–5.2)
Sodium: 140 mmol/L (ref 134–144)
eGFR: 77 mL/min/1.73 (ref >59)

## 2023-09-27 MED ORDER — COVID-19 MRNA VAC-TRIS(PFIZER) 30 MCG/0.3ML IM SUSY
0.3000 mL | PREFILLED_SYRINGE | Freq: Once | INTRAMUSCULAR | 0 refills | Status: AC
  Filled 2023-09-27 (×2): qty 0.3, 1d supply, fill #0

## 2023-09-27 NOTE — Assessment & Plan Note (Signed)
 Patient ran out of Ambien  5 mg for about 1 month.  He tried over-the-counter melatonin and was unable to maintain sleep.  Will refill Ambien  5 mg due to melatonin not working. Plan: - Ambien  refilled -Contract updated, tox assure obtained

## 2023-09-27 NOTE — Assessment & Plan Note (Signed)
 Patient presents with a history of hypertension with a blood pressure today of 187/76, recheck of 142/84. Their hypertension is uncontrolled on a regimen of amlodipine  5 omlesartan 5-20, and hydrochlorothiazide  12.5 mg.  Prior BMP in August 2024, Scr was 1.10. Plan: -Will continue amlodipine  5 omlesartan 5-20, and increase hydrochlorothiazide  to 25 mg daily  -BMP collected

## 2023-09-27 NOTE — Assessment & Plan Note (Signed)
 Patient has a history of bilateral knee osteoarthritis.  In the past he has completed physical therapy and steroid injections.  He saw orthopedics and was unable to afford follow-up copayments with his prior insurance.  He reports using daily ibuprofen  use, about 400 mg daily, and Voltaren gel.  Fortunately, patient's insurance has changed and he is willing to go back to the orthopedic surgeon to discuss surgery versus injections.  Plan: - Patient instructed to follow-up with orthopedic surgeon -Patient was informed to stop using daily ibuprofen .  We discussed using Tylenol  (less than 4,000 mg daily), and Voltaren gel.  Patient was instructed to only use ibuprofen  if Tylenol  and Voltaren gel are not controlling his pain -Patient requested knee brace to assist with stability when he is walking due to prior episodes of his knee giving out, DME for knee brace ordered (patient is aware that this will not resolve his underlying osteoarthritis and that he needs to follow-up with orthopedic surgery)

## 2023-09-30 LAB — TOXASSURE SELECT,+ANTIDEPR,UR

## 2023-09-30 NOTE — Progress Notes (Signed)
 Internal Medicine Clinic Attending  Case discussed with the resident at the time of the visit.  We reviewed the resident's history and exam and pertinent patient test results.  I agree with the assessment, diagnosis, and plan of care documented in the resident's note.

## 2023-10-29 ENCOUNTER — Telehealth: Payer: Self-pay | Admitting: Internal Medicine

## 2023-10-29 NOTE — Telephone Encounter (Signed)
 Confirmation form was signed and faxed below:   This message was sent via FAXCOM, a product from [employer]. [email or web address]/                    -------Fax Transmission Report-------  To:               Recipient at [phone] Subject:          hanger/Mikle Result:           The transmission was successful. Explanation:      All Pages Ok Pages Sent:       3 Connect Time:     1 minutes, 19 seconds Transmit Time:    10/29/2023 09:19 Transfer Rate:    14400 Status Code:      0000 Retry Count:      0 Job Id:           [id number] Unique Id:        [id number] Fax Line:         69 Fax Server:       [id number]    Copied from CRM [id number]. Topic: Referral - Status >> Oct 25, 2023  1:29 PM DeAngela L wrote: Reason for CRM: Odella calling with the DME providers office received a referral for the patients knee braces and she is calling back to ask if the fax 2nd sent on 10/17/23 was received by the office? She states the 1st fax was returned but was blank and this is why she is checking the status and turn around time for the 2nd fax from the office  [place] phone [phone] Fax num [phone]

## 2023-11-01 ENCOUNTER — Telehealth: Payer: Self-pay | Admitting: *Deleted

## 2023-11-01 NOTE — Telephone Encounter (Signed)
 Copied from CRM [id number]. Topic: General - Other >> Nov 01, 2023 11:53 AM Susanna ORN wrote: Reason for CRM: Eleanor, with [facility], called in stating that they received a SWO that was signed by Dr. Rosan. She states that it was suppose to be for Dr. Kandis to sign. Melissa states they will need for Dr. Rosan to cross out her information and changed it to his and also his NPI as well. States the information that has Dr. Oscar information will need to be changed to Dr. Athena. For further questions or concerns, please call [phone].

## 2024-01-23 ENCOUNTER — Telehealth: Payer: Self-pay | Admitting: *Deleted

## 2024-01-23 NOTE — Telephone Encounter (Signed)
 Message has been re routed to [facility] [employer] to f/u.  Copied from CRM #[id number]. Topic: General - Other >> Jan 23, 2024  9:41 AM Marda MATSU wrote: Reason for CRM: Supplemental benefits form we faxed you was incorrect please go to  [email or web address]  to print a correct one. It will be under:   [employer]BCI verification form -[employer] medicare advantage plan   Please print the correct form and submit please.    If you are having difficulties you can also call   [phone] General Member Services

## 2024-01-23 NOTE — Telephone Encounter (Signed)
 Call from pt States [employer] needs proof that he is being treated for OA and has faxed Chronic Condition Verification Form  to Dr Rosan for completion. States form was faxed over yesterday CMA spoke with front office staff and checked MD box-no form has been received  Pt aware and will contact insurance back

## 2024-01-23 NOTE — Telephone Encounter (Signed)
 I called and spoke with pt to let him know that I have not received his chronic condition verification from [facility]. Pt states he already spoke with his insurance, and they do not need anything from us . Pt states the insurance has all the information already. Advised pt to call the clinic back if he needs further assistance.

## 2024-04-13 ENCOUNTER — Ambulatory Visit: Payer: Self-pay

## 2024-04-15 ENCOUNTER — Ambulatory Visit
